# Patient Record
Sex: Male | Born: 1950 | Race: White | Hispanic: No | Marital: Married | Smoking: Former smoker
Health system: Southern US, Community
[De-identification: ages and names within clinical notes are randomized; demographics above are authoritative.]

## PROBLEM LIST (undated history)

## (undated) DIAGNOSIS — C679 Malignant neoplasm of bladder, unspecified: Secondary | ICD-10-CM

## (undated) DIAGNOSIS — G629 Polyneuropathy, unspecified: Secondary | ICD-10-CM

## (undated) DIAGNOSIS — H539 Unspecified visual disturbance: Secondary | ICD-10-CM

## (undated) DIAGNOSIS — E119 Type 2 diabetes mellitus without complications: Secondary | ICD-10-CM

## (undated) HISTORY — DX: Polyneuropathy, unspecified: G62.9

## (undated) HISTORY — PX: INGUINAL HERNIA REPAIR: SUR1180

## (undated) HISTORY — DX: Type 2 diabetes mellitus without complications: E11.9

## (undated) HISTORY — DX: Malignant neoplasm of bladder, unspecified: C67.9

## (undated) HISTORY — DX: Unspecified visual disturbance: H53.9

## (undated) HISTORY — PX: OTHER SURGICAL HISTORY: SHX169

---

## 2016-09-11 LAB — HEMOGLOBIN A1C: HEMOGLOBIN A1C: 11.5

## 2016-10-29 ENCOUNTER — Encounter: Payer: Self-pay | Admitting: "Endocrinology

## 2016-10-29 ENCOUNTER — Ambulatory Visit (INDEPENDENT_AMBULATORY_CARE_PROVIDER_SITE_OTHER): Payer: 59 | Admitting: "Endocrinology

## 2016-10-29 VITALS — BP 140/84 | HR 63 | Ht 69.0 in | Wt 189.0 lb

## 2016-10-29 DIAGNOSIS — E1165 Type 2 diabetes mellitus with hyperglycemia: Secondary | ICD-10-CM

## 2016-10-29 DIAGNOSIS — E118 Type 2 diabetes mellitus with unspecified complications: Secondary | ICD-10-CM | POA: Diagnosis not present

## 2016-10-29 DIAGNOSIS — IMO0002 Reserved for concepts with insufficient information to code with codable children: Secondary | ICD-10-CM | POA: Insufficient documentation

## 2016-10-29 NOTE — Progress Notes (Signed)
Subjective:    Patient ID: Danny Wagner, male    DOB: 09-Feb-1951. Patient is being seen in consultation for management of diabetes requested by  Josem Kaufmann, MD  Past Medical History:  Diagnosis Date  . Bladder cancer ([place])   . Diabetes mellitus, type II [facility])    Past Surgical History:  Procedure Laterality Date  . bladder cancer    . INGUINAL HERNIA REPAIR     Social History   Social History  . Marital status: Married    Spouse name: N/A  . Number of children: N/A  . Years of education: N/A   Social History Main Topics  . Smoking status: Former Research scientist (life sciences)  . Smokeless tobacco: Never Used  . Alcohol use Yes     Comment: 5-6 beers/wk  . Drug use: No  . Sexual activity: Not Asked   Other Topics Concern  . None   Social History Narrative  . None   Outpatient Encounter Prescriptions as of 10/29/2016  Medication Sig  . metFORMIN (GLUCOPHAGE) 500 MG tablet Take by mouth 2 (two) times daily with a meal.   No facility-administered encounter medications on file as of 10/29/2016.    ALLERGIES: No Known Allergies VACCINATION STATUS:  There is no immunization history on file for this patient.  Diabetes  He presents for his initial diabetic visit. He has type 2 diabetes mellitus. Onset time: He was diagnosed at approximate age of 23 years. His disease course has been improving. There are no hypoglycemic associated symptoms. Pertinent negatives for hypoglycemia include no confusion, headaches, pallor or seizures. Associated symptoms include blurred vision, polydipsia and polyuria. Pertinent negatives for diabetes include no chest pain, no fatigue, no polyphagia and no weakness. There are no hypoglycemic complications. Symptoms are improving (He was diagnosed in April 2018 with A1c of 11.5%, with subsequent improvement in his glycemic profile.). There are no diabetic complications. Risk factors for coronary artery disease include diabetes mellitus, dyslipidemia, tobacco exposure,  male sex, hypertension and family history. Current diabetic treatment includes oral agent (monotherapy). His weight is stable. He is following a generally unhealthy diet. When asked about meal planning, he reported none. He has not had a previous visit with a dietitian. He participates in exercise intermittently. His overall blood glucose range is 140-180 mg/dl. An ACE inhibitor/angiotensin II receptor blocker is not being taken. He does not see a podiatrist.Eye exam is not current.    Review of Systems  Constitutional: Negative for chills, fatigue, fever and unexpected weight change.  HENT: Negative for dental problem, mouth sores and trouble swallowing.   Eyes: Positive for blurred vision. Negative for visual disturbance.  Respiratory: Negative for cough, choking, chest tightness, shortness of breath and wheezing.   Cardiovascular: Negative for chest pain, palpitations and leg swelling.  Gastrointestinal: Negative for abdominal distention, abdominal pain, constipation, diarrhea, nausea and vomiting.  Endocrine: Positive for polydipsia and polyuria. Negative for polyphagia.  Genitourinary: Negative for dysuria, flank pain, hematuria and urgency.  Musculoskeletal: Negative for back pain, gait problem, myalgias and neck pain.  Skin: Negative for pallor, rash and wound.  Neurological: Negative for seizures, syncope, weakness, numbness and headaches.  Psychiatric/Behavioral: Negative.  Negative for confusion and dysphoric mood.    Objective:    BP 140/84   Pulse 63   Ht 5\' 9"  (1.753 m)   Wt 189 lb (85.7 kg)   BMI 27.91 kg/m   Wt Readings from Last 3 Encounters:  10/29/16 189 lb (85.7 kg)    Physical Exam  Constitutional: He is oriented to person, place, and time. He appears well-developed and well-nourished. He is cooperative. No distress.  HENT:  Head: Normocephalic and atraumatic.  Eyes: EOM are normal.  Neck: Normal range of motion. Neck supple. No tracheal deviation present. No  thyromegaly present.  Cardiovascular: Normal rate, S1 normal, S2 normal and normal heart sounds.  Exam reveals no gallop.   No murmur heard. Pulses:      Dorsalis pedis pulses are 1+ on the right side, and 1+ on the left side.       Posterior tibial pulses are 1+ on the right side, and 1+ on the left side.  Pulmonary/Chest: Breath sounds normal. No respiratory distress. He has no wheezes.  Abdominal: Soft. Bowel sounds are normal. He exhibits no distension. There is no tenderness. There is no guarding and no CVA tenderness.  Musculoskeletal: He exhibits no edema.       Right shoulder: He exhibits no swelling and no deformity.  Neurological: He is alert and oriented to person, place, and time. He has normal strength and normal reflexes. No cranial nerve deficit or sensory deficit. Gait normal.  + Hard of hearing.  Skin: Skin is warm and dry. No rash noted. No cyanosis. Nails show no clubbing.  Psychiatric: He has a normal mood and affect. His speech is normal and behavior is normal. Judgment and thought content normal. Cognition and memory are normal.   Recent Results (from the past 2160 hour(s))  Hemoglobin A1c     Status: None   Collection Time: 09/11/16 12:00 AM  Result Value Ref Range   Hemoglobin A1C 11.5       Assessment & Plan:   1. Uncontrolled type 2 diabetes mellitus with complication, without long-term current use of insulin ([place])  - Patient has currently uncontrolled symptomatic type 2 DM since  66 years of age,  with most recent A1c of 11.5 %. He has improved his glycemic profile on metformin since his diagnosis. Recent labs reviewed.   He does not report cross competitions from his diabetes, however patient remains at a high risk for more acute and chronic complications which include CAD, CVA, CKD, retinopathy, and neuropathy. These are all discussed in detail with the patient.  - I have counseled the patient on diet management and weight loss, by adopting a carbohydrate  restricted/protein rich diet.  - Suggestion is made for patient to avoid simple carbohydrates   from his diet including Cakes , Desserts, Ice Cream,  Soda (  diet and regular) , Sweet Tea , Candies,  Chips, Cookies, Artificial Sweeteners,   and "Sugar-free" Products . This will help patient to have stable blood glucose profile and potentially avoid unintended weight gain.  - I encouraged the patient to switch to  unprocessed or minimally processed complex starch and increased protein intake (animal or plant source), fruits, and vegetables.  - Patient is advised to stick to a routine mealtimes to eat 3 meals  a day and avoid unnecessary snacks ( to snack only to correct hypoglycemia).  - The patient will be scheduled with Jearld Fenton, RDN, CDE for individualized DM education.  - I have approached patient with the following individualized plan to manage diabetes and patient agrees:   - Based on his clinical response to therapy with metformin with glycemic profile approaching target, he will not need additional treatment at this time.  - I will contmetformin 500 minute grams by mouth twice a day, therapeutically suitable for patient . - I  will discontinue glimepiride, risk outweighs benefit for this patient. - Patient has choices in  incretin therapy, as well as SGLT 2 inhibitors  as appropriate next visit. - Patient specific target  A1c;  LDL, HDL, Triglycerides, and  Waist Circumference were discussed in detail.  2) BP/HTN: controlled.  Patient is not on any medications.  3) Lipids/HPL: lipid panel unknown.   and is not on statins. He will be considered for fasting lipid panel on subsequent visit.  4)  Weight/Diet: CDE Consult will be initiated , exercise, and detailed carbohydrates information provided.  5) Chronic Care/Health Maintenance:  -Patient is not  on ACEI/ARB and Statin medications and encouraged to continue to follow up with Ophthalmology, Podiatrist at least yearly or according  to recommendations, and advised to   stay away from smoking. I have recommended yearly flu vaccine and pneumonia vaccination at least every 5 years; moderate intensity exercise for up to 150 minutes weekly; and  sleep for at least 7 hours a day.  - 60 minutes of time was spent on the care of this patient , 50% of which was applied for counseling on diabetes complications and their preventions.  - Patient to bring meter and  blood glucose logs during his next visit.   - I advised patient to maintain close follow up with Josem Kaufmann, MD for primary care needs.  Follow up plan: - Return in about 9 weeks (around 12/31/2016) for follow up with pre-visit labs.  Glade Lloyd, MD Phone: [phone]  Fax: [phone]   10/29/2016, 3:38 PM

## 2016-10-29 NOTE — Patient Instructions (Signed)

## 2016-12-31 ENCOUNTER — Ambulatory Visit: Payer: 59 | Admitting: "Endocrinology

## 2017-01-10 LAB — LIPID PANEL
Cholesterol: 139 (ref 0–200)
HDL: 36 (ref 35–70)
LDL Cholesterol: 86
Triglycerides: 83 (ref 40–160)

## 2017-01-10 LAB — HEMOGLOBIN A1C: HEMOGLOBIN A1C: 7.6

## 2017-01-10 LAB — TSH: TSH: 1.45 (ref <=5.90)

## 2017-01-13 ENCOUNTER — Ambulatory Visit: Payer: 59 | Admitting: "Endocrinology

## 2017-01-15 ENCOUNTER — Ambulatory Visit (INDEPENDENT_AMBULATORY_CARE_PROVIDER_SITE_OTHER): Payer: 59 | Admitting: "Endocrinology

## 2017-01-15 ENCOUNTER — Encounter: Payer: Self-pay | Admitting: "Endocrinology

## 2017-01-15 VITALS — BP 121/76 | HR 61 | Ht 69.0 in | Wt 185.0 lb

## 2017-01-15 DIAGNOSIS — E118 Type 2 diabetes mellitus with unspecified complications: Secondary | ICD-10-CM | POA: Diagnosis not present

## 2017-01-15 DIAGNOSIS — E1165 Type 2 diabetes mellitus with hyperglycemia: Secondary | ICD-10-CM

## 2017-01-15 DIAGNOSIS — IMO0002 Reserved for concepts with insufficient information to code with codable children: Secondary | ICD-10-CM

## 2017-01-15 MED ORDER — DAPAGLIFLOZIN PROPANEDIOL 5 MG PO TABS: 5.0000 mg | ORAL_TABLET | Freq: Every day | ORAL | 3 refills | Status: DC

## 2017-01-15 NOTE — Patient Instructions (Signed)

## 2017-01-15 NOTE — Progress Notes (Signed)
Subjective:    Patient ID: Danny Wagner, male    DOB: 02-03-1951. Patient is being seen in consultation for management of diabetes requested by  Josem Kaufmann, MD  Past Medical History:  Diagnosis Date  . Bladder cancer ([place])   . Diabetes mellitus, type II [facility])    Past Surgical History:  Procedure Laterality Date  . bladder cancer    . INGUINAL HERNIA REPAIR     Social History   Social History  . Marital status: Married    Spouse name: N/A  . Number of children: N/A  . Years of education: N/A   Social History Main Topics  . Smoking status: Former Research scientist (life sciences)  . Smokeless tobacco: Never Used  . Alcohol use Yes     Comment: 5-6 beers/wk  . Drug use: No  . Sexual activity: Not Asked   Other Topics Concern  . None   Social History Narrative  . None   Outpatient Encounter Prescriptions as of 01/15/2017  Medication Sig  . dapagliflozin propanediol (FARXIGA) 5 MG TABS tablet Take 5 mg by mouth daily.  . metFORMIN (GLUCOPHAGE) 500 MG tablet Take by mouth 2 (two) times daily with a meal.   No facility-administered encounter medications on file as of 01/15/2017.    ALLERGIES: No Known Allergies VACCINATION STATUS:  There is no immunization history on file for this patient.  Diabetes  He presents for his follow-up diabetic visit. He has type 2 diabetes mellitus. Onset time: He was diagnosed at approximate age of 50 years. His disease course has been improving. There are no hypoglycemic associated symptoms. Pertinent negatives for hypoglycemia include no confusion, headaches, pallor or seizures. Associated symptoms include blurred vision. Pertinent negatives for diabetes include no chest pain, no fatigue, no polydipsia, no polyphagia, no polyuria and no weakness. There are no hypoglycemic complications. Symptoms are improving (He was diagnosed in April 2018 with A1c of 11.5%.). There are no diabetic complications. Risk factors for coronary artery disease include diabetes  mellitus, dyslipidemia, tobacco exposure, male sex, hypertension and family history. Current diabetic treatment includes oral agent (monotherapy). His weight is stable. He is following a generally unhealthy diet. When asked about meal planning, he reported none. He has not had a previous visit with a dietitian. He participates in exercise intermittently. An ACE inhibitor/angiotensin II receptor blocker is not being taken. He does not see a podiatrist.Eye exam is not current.    Review of Systems  Constitutional: Negative for chills, fatigue, fever and unexpected weight change.  HENT: Negative for dental problem, mouth sores and trouble swallowing.   Eyes: Positive for blurred vision. Negative for visual disturbance.  Respiratory: Negative for cough, choking, chest tightness, shortness of breath and wheezing.   Cardiovascular: Negative for chest pain, palpitations and leg swelling.  Gastrointestinal: Negative for abdominal distention, abdominal pain, constipation, diarrhea, nausea and vomiting.  Endocrine: Negative for polydipsia, polyphagia and polyuria.  Genitourinary: Negative for dysuria, flank pain, hematuria and urgency.  Musculoskeletal: Negative for back pain, gait problem, myalgias and neck pain.  Skin: Negative for pallor, rash and wound.  Neurological: Negative for seizures, syncope, weakness, numbness and headaches.  Psychiatric/Behavioral: Negative.  Negative for confusion and dysphoric mood.    Objective:    BP 121/76   Pulse 61   Ht 5\' 9"  (1.753 m)   Wt 185 lb (83.9 kg)   BMI 27.32 kg/m   Wt Readings from Last 3 Encounters:  01/15/17 185 lb (83.9 kg)  10/29/16 189 lb (85.7 kg)  Physical Exam  Constitutional: He is oriented to person, place, and time. He appears well-developed and well-nourished. He is cooperative. No distress.  HENT:  Head: Normocephalic and atraumatic.  Eyes: EOM are normal.  Neck: Normal range of motion. Neck supple. No tracheal deviation present.  No thyromegaly present.  Cardiovascular: Normal rate, S1 normal, S2 normal and normal heart sounds.  Exam reveals no gallop.   No murmur heard. Pulses:      Dorsalis pedis pulses are 1+ on the right side, and 1+ on the left side.       Posterior tibial pulses are 1+ on the right side, and 1+ on the left side.  Pulmonary/Chest: Breath sounds normal. No respiratory distress. He has no wheezes.  Abdominal: Soft. Bowel sounds are normal. He exhibits no distension. There is no tenderness. There is no guarding and no CVA tenderness.  Musculoskeletal: He exhibits no edema.       Right shoulder: He exhibits no swelling and no deformity.  Neurological: He is alert and oriented to person, place, and time. He has normal strength and normal reflexes. No cranial nerve deficit or sensory deficit. Gait normal.  + Hard of hearing.  Skin: Skin is warm and dry. No rash noted. No cyanosis. Nails show no clubbing.  Psychiatric: He has a normal mood and affect. His speech is normal and behavior is normal. Judgment and thought content normal. Cognition and memory are normal.    Recent Results (from the past 2160 hour(s))  Lipid panel     Status: None   Collection Time: 01/10/17 12:00 AM  Result Value Ref Range   Triglycerides 83 40 - 160   Cholesterol 139 0 - 200   HDL 36 35 - 70   LDL Cholesterol 86   Hemoglobin A1c     Status: None   Collection Time: 01/10/17 12:00 AM  Result Value Ref Range   Hemoglobin A1C 7.6   TSH     Status: None   Collection Time: 01/10/17 12:00 AM  Result Value Ref Range   TSH 1.45 0.41 - 5.90    Comment: free t4 1.04     Assessment & Plan:   1. Uncontrolled type 2 diabetes mellitus with complication, without long-term current use of insulin ([place])  - Patient has currently uncontrolled symptomatic type 2 DM since  66 years of age. - His most recent labs show A1c 7.6% improving from 11.5%. Recent labs reviewed.   He does not report gross complications from his diabetes,  however patient remains at a high risk for more acute and chronic complications which include CAD, CVA, CKD, retinopathy, and neuropathy. These are all discussed in detail with the patient.  - I have counseled the patient on diet management and weight loss, by adopting a carbohydrate restricted/protein rich diet.  Suggestion is made for him to avoid simple carbohydrates  from his diet including Cakes, Sweet Desserts, Ice Cream, Soda (diet and regular), Sweet Tea, Candies, Chips, Cookies, Store Bought Juices, Alcohol in Excess of  1-2 drinks a day, Artificial Sweeteners, and "Sugar-free" Products. This will help patient to have stable blood glucose profile and potentially avoid unintended weight gain.   - I encouraged the patient to switch to  unprocessed or minimally processed complex starch and increased protein intake (animal or plant source), fruits, and vegetables.  - Patient is advised to stick to a routine mealtimes to eat 3 meals  a day and avoid unnecessary snacks ( to snack only to correct hypoglycemia).   -  I have approached patient with the following individualized plan to manage diabetes and patient agrees:   - Based on his clinical response to therapy with metformin with glycemic profile approaching target, he will not need   insulin treatment at this time.  - I will contmetformin 500 minute grams by mouth twice a day, therapeutically suitable for patient . - I have discussed and added low-dose Farxiga 5 mg by mouth daily for him. Side effects and precaution discussed with him.    - Patient  is not a suitable candidate for  incretin therapy. - Patient specific target  A1c;  LDL, HDL, Triglycerides, and  Waist Circumference were discussed in detail.  2) BP/HTN: controlled.  Patient is not on any medications.  3) Lipids/HPL: lipid panel is controlled with LDL 86.   and is not on statins.  he wishes to minimize medications including statins.  4)  Weight/Diet: CDE Consult has been   initiated , exercise, and detailed carbohydrates information provided.  5) Chronic Care/Health Maintenance:  -Patient is not  on ACEI/ARB and Statin medications and encouraged to continue to follow up with Ophthalmology, Podiatrist at least yearly or according to recommendations, and advised to   stay away from smoking. I have recommended yearly flu vaccine and pneumonia vaccination at least every 5 years; moderate intensity exercise for up to 150 minutes weekly; and  sleep for at least 7 hours a day.  - Time spent with the patient: 25 min, of which >50% was spent in reviewing his sugar logs , discussing his hypo- and hyper-glycemic episodes, reviewing  previous labs and insulin doses and developing a plan to avoid hypo- and hyper-glycemia.  .  - Patient to bring meter and  blood glucose logs during his next visit.   - I advised patient to maintain close follow up with Josem Kaufmann, MD for primary care needs.  Follow up plan: - Return in about 4 months (around 05/17/2017) for follow up with pre-visit labs.  Glade Lloyd, MD Phone: [phone]  Fax: [phone]   01/15/2017, 3:29 PM

## 2017-01-30 ENCOUNTER — Ambulatory Visit: Payer: 59 | Admitting: "Endocrinology

## 2017-05-19 ENCOUNTER — Ambulatory Visit: Payer: 59 | Admitting: "Endocrinology

## 2017-08-18 ENCOUNTER — Other Ambulatory Visit: Payer: Self-pay | Admitting: "Endocrinology

## 2017-08-18 ENCOUNTER — Telehealth: Payer: Self-pay | Admitting: "Endocrinology

## 2017-08-18 ENCOUNTER — Other Ambulatory Visit: Payer: Self-pay

## 2017-08-18 DIAGNOSIS — E1165 Type 2 diabetes mellitus with hyperglycemia: Secondary | ICD-10-CM

## 2017-08-18 NOTE — Telephone Encounter (Signed)
Danny Wagner is asking for a refill on metFORMIN (GLUCOPHAGE) 500 MG tablet sent to [facility]

## 2017-08-18 NOTE — Telephone Encounter (Signed)
He needs to return for office visit with repeat labs before refill.

## 2017-08-21 ENCOUNTER — Other Ambulatory Visit: Payer: Self-pay | Admitting: "Endocrinology

## 2017-08-22 LAB — RENAL FUNCTION PANEL
Albumin: 4.5 g/dL (ref 3.6–4.8)
BUN / CREAT RATIO: 17 (ref 10–24)
BUN: 18 mg/dL (ref 8–27)
CALCIUM: 9.8 mg/dL (ref 8.6–10.2)
CO2: 25 mmol/L (ref 20–29)
CREATININE: 1.08 mg/dL (ref 0.76–1.27)
Chloride: 102 mmol/L (ref 96–106)
GFR calc Af Amer: 83 mL/min/{1.73_m2} (ref >59)
GFR, EST NON AFRICAN AMERICAN: 72 mL/min/{1.73_m2} (ref >59)
GLUCOSE: 155 mg/dL — AB (ref 65–99)
PHOSPHORUS: 3.4 mg/dL (ref 2.5–4.5)
POTASSIUM: 5.2 mmol/L (ref 3.5–5.2)
SODIUM: 141 mmol/L (ref 134–144)

## 2017-08-22 LAB — HGB A1C W/O EAG: Hgb A1c MFr Bld: 7 % — ABNORMAL HIGH (ref 4.8–5.6)

## 2017-08-28 ENCOUNTER — Other Ambulatory Visit: Payer: Self-pay | Admitting: "Endocrinology

## 2017-08-28 MED ORDER — METFORMIN HCL 500 MG PO TABS: 500.0000 mg | ORAL_TABLET | Freq: Two times a day (BID) | ORAL | 2 refills | Status: DC

## 2017-08-28 NOTE — Telephone Encounter (Signed)
Danny Wagner has had labs drawn and is scheduled for f/u on April 3 and he is asking for a refill on metFORMIN (GLUCOPHAGE) 500 MG tablet please advise?

## 2017-08-29 NOTE — Telephone Encounter (Signed)
Rx sent by Dr Dorris Fetch

## 2017-09-01 ENCOUNTER — Encounter: Payer: Self-pay | Admitting: "Endocrinology

## 2017-09-01 ENCOUNTER — Ambulatory Visit (INDEPENDENT_AMBULATORY_CARE_PROVIDER_SITE_OTHER): Payer: 59 | Admitting: "Endocrinology

## 2017-09-01 VITALS — BP 134/89 | HR 80 | Ht 69.0 in | Wt 181.0 lb

## 2017-09-01 DIAGNOSIS — E1165 Type 2 diabetes mellitus with hyperglycemia: Secondary | ICD-10-CM | POA: Diagnosis not present

## 2017-09-01 MED ORDER — METFORMIN HCL 500 MG PO TABS: 500.0000 mg | ORAL_TABLET | Freq: Two times a day (BID) | ORAL | 4 refills | Status: DC

## 2017-09-01 MED ORDER — GABAPENTIN 100 MG PO CAPS: 100.0000 mg | ORAL_CAPSULE | Freq: Two times a day (BID) | ORAL | 4 refills | Status: DC

## 2017-09-01 NOTE — Progress Notes (Signed)
Subjective:    Patient ID: Danny Wagner, male    DOB: 11/29/1950. Patient is being seen in consultation for management of diabetes requested by  Josem Kaufmann, MD  Past Medical History:  Diagnosis Date  . Bladder cancer ([place])   . Diabetes mellitus, type II [facility])    Past Surgical History:  Procedure Laterality Date  . bladder cancer    . INGUINAL HERNIA REPAIR     Social History   Socioeconomic History  . Marital status: Married    Spouse name: Not on file  . Number of children: Not on file  . Years of education: Not on file  . Highest education level: Not on file  Occupational History  . Not on file  Social Needs  . Financial resource strain: Not on file  . Food insecurity:    Worry: Not on file    Inability: Not on file  . Transportation needs:    Medical: Not on file    Non-medical: Not on file  Tobacco Use  . Smoking status: Former Research scientist (life sciences)  . Smokeless tobacco: Never Used  Substance and Sexual Activity  . Alcohol use: Yes    Comment: 5-6 beers/wk  . Drug use: No  . Sexual activity: Not on file  Lifestyle  . Physical activity:    Days per week: Not on file    Minutes per session: Not on file  . Stress: Not on file  Relationships  . Social connections:    Talks on phone: Not on file    Gets together: Not on file    Attends religious service: Not on file    Active member of club or organization: Not on file    Attends meetings of clubs or organizations: Not on file    Relationship status: Not on file  Other Topics Concern  . Not on file  Social History Narrative  . Not on file   Outpatient Encounter Medications as of 09/01/2017  Medication Sig  . gabapentin (NEURONTIN) 100 MG capsule Take 1 capsule (100 mg total) by mouth 2 (two) times daily.  . metFORMIN (GLUCOPHAGE) 500 MG tablet Take 1 tablet (500 mg total) by mouth 2 (two) times daily with a meal.  . [DISCONTINUED] dapagliflozin propanediol (FARXIGA) 5 MG TABS tablet Take 5 mg by mouth daily.  .  [DISCONTINUED] metFORMIN (GLUCOPHAGE) 500 MG tablet Take 1 tablet (500 mg total) by mouth 2 (two) times daily with a meal.   No facility-administered encounter medications on file as of 09/01/2017.    ALLERGIES: No Known Allergies VACCINATION STATUS:  There is no immunization history on file for this patient.  Diabetes  He presents for his follow-up diabetic visit. He has type 2 diabetes mellitus. Onset time: He was diagnosed at approximate age of 67 years. His disease course has been improving. There are no hypoglycemic associated symptoms. Pertinent negatives for hypoglycemia include no confusion, headaches, pallor or seizures. Associated symptoms include blurred vision. Pertinent negatives for diabetes include no chest pain, no fatigue, no polydipsia, no polyphagia, no polyuria and no weakness. There are no hypoglycemic complications. Symptoms are improving (He was diagnosed in April 2018 with A1c of 11.5%.). Diabetic complications include peripheral neuropathy. Risk factors for coronary artery disease include diabetes mellitus, dyslipidemia, tobacco exposure, male sex, hypertension and family history. Current diabetic treatment includes oral agent (monotherapy). His weight is decreasing steadily. He is following a generally unhealthy diet. When asked about meal planning, he reported none. He has not had a previous  visit with a dietitian. He participates in exercise intermittently. An ACE inhibitor/angiotensin II receptor blocker is not being taken. He does not see a podiatrist.Eye exam is not current.    Review of Systems  Constitutional: Negative for chills, fatigue, fever and unexpected weight change.  HENT: Negative for dental problem, mouth sores and trouble swallowing.   Eyes: Positive for blurred vision. Negative for visual disturbance.  Respiratory: Negative for cough, choking, chest tightness, shortness of breath and wheezing.   Cardiovascular: Negative for chest pain, palpitations and  leg swelling.  Gastrointestinal: Negative for abdominal distention, abdominal pain, constipation, diarrhea, nausea and vomiting.  Endocrine: Negative for polydipsia, polyphagia and polyuria.  Genitourinary: Negative for dysuria, flank pain, hematuria and urgency.  Musculoskeletal: Negative for back pain, gait problem, myalgias and neck pain.  Skin: Negative for pallor, rash and wound.  Neurological: Negative for seizures, syncope, weakness, numbness and headaches.  Psychiatric/Behavioral: Negative.  Negative for confusion and dysphoric mood.    Objective:    BP 134/89   Pulse 80   Ht 5\' 9"  (1.753 m)   Wt 181 lb (82.1 kg)   BMI 26.73 kg/m   Wt Readings from Last 3 Encounters:  09/01/17 181 lb (82.1 kg)  01/15/17 185 lb (83.9 kg)  10/29/16 189 lb (85.7 kg)    Physical Exam  Constitutional: He is oriented to person, place, and time. He appears well-developed and well-nourished. He is cooperative. No distress.  HENT:  Head: Normocephalic and atraumatic.  Eyes: EOM are normal.  Neck: Normal range of motion. Neck supple. No tracheal deviation present. No thyromegaly present.  Cardiovascular: Normal rate, S1 normal, S2 normal and normal heart sounds. Exam reveals no gallop.  No murmur heard. Pulses:      Dorsalis pedis pulses are 1+ on the right side, and 1+ on the left side.       Posterior tibial pulses are 1+ on the right side, and 1+ on the left side.  Pulmonary/Chest: Breath sounds normal. No respiratory distress. He has no wheezes.  Abdominal: Soft. Bowel sounds are normal. He exhibits no distension. There is no tenderness. There is no guarding and no CVA tenderness.  Musculoskeletal: He exhibits no edema.       Right shoulder: He exhibits no swelling and no deformity.  Neurological: He is alert and oriented to person, place, and time. He has normal strength and normal reflexes. No cranial nerve deficit or sensory deficit. Gait normal.  + Hard of hearing.  Skin: Skin is warm  and dry. No rash noted. No cyanosis. Nails show no clubbing.  Psychiatric: He has a normal mood and affect. His speech is normal and behavior is normal. Judgment and thought content normal. Cognition and memory are normal.    Recent Results (from the past 2160 hour(s))  Renal function panel     Status: Abnormal   Collection Time: 08/21/17  3:25 PM  Result Value Ref Range   Glucose 155 (H) 65 - 99 mg/dL   BUN 18 8 - 27 mg/dL   Creatinine, Ser 1.08 0.76 - 1.27 mg/dL   GFR calc non Af Amer 72 >59 mL/min/1.73   GFR calc Af Amer 83 >59 mL/min/1.73   BUN/Creatinine Ratio 17 10 - 24   Sodium 141 134 - 144 mmol/L   Potassium 5.2 3.5 - 5.2 mmol/L   Chloride 102 96 - 106 mmol/L   CO2 25 20 - 29 mmol/L   Calcium 9.8 8.6 - 10.2 mg/dL   Phosphorus 3.4 2.5 -  4.5 mg/dL   Albumin 4.5 3.6 - 4.8 g/dL  Hgb A1c w/o eAG     Status: Abnormal   Collection Time: 08/21/17  3:25 PM  Result Value Ref Range   Hgb A1c MFr Bld 7.0 (H) 4.8 - 5.6 %    Comment:          Prediabetes: 5.7 - 6.4          Diabetes: >6.4          Glycemic control for adults with diabetes: <7.0      Assessment & Plan:   1. Uncontrolled type 2 diabetes mellitus with complication, without long-term current use of insulin ([place])  - Patient has currently uncontrolled symptomatic type 2 DM since  67 years of age. - His most recent labs show A1c 7%, progressively improving from 11.5%. Recent labs reviewed.   He does not report gross complications from his diabetes, however patient remains at a high risk for more acute and chronic complications which include CAD, CVA, CKD, retinopathy, and neuropathy. These are all discussed in detail with the patient.  - I have counseled the patient on diet management and weight loss, by adopting a carbohydrate restricted/protein rich diet.  -  Suggestion is made for him to avoid simple carbohydrates  from his diet including Cakes, Sweet Desserts / Pastries, Ice Cream, Soda (diet and regular), Sweet  Tea, Candies, Chips, Cookies, Store Bought Juices, Alcohol in Excess of  1-2 drinks a day, Artificial Sweeteners, and "Sugar-free" Products. This will help patient to have stable blood glucose profile and potentially avoid unintended weight gain.  - I encouraged the patient to switch to  unprocessed or minimally processed complex starch and increased protein intake (animal or plant source), fruits, and vegetables.  - Patient is advised to stick to a routine mealtimes to eat 3 meals  a day and avoid unnecessary snacks ( to snack only to correct hypoglycemia).   - I have approached patient with the following individualized plan to manage diabetes and patient agrees:   - Based on his clinical response to therapy with improved A1c of 7%, he will not need   insulin treatment at this time.  - I advised him to continue metformin 500 mg p.o. twice daily with meals.   -Advised him to finish his current supplies of first cigar and discontinued.  Side effects and precaution discussed with him.    - Patient  is not a suitable candidate for  incretin therapy. - Patient specific target  A1c;  LDL, HDL, Triglycerides, and  Waist Circumference were discussed in detail.  2) BP/HTN: His blood pressure is controlled to target.    Patient is not on any medications.  3) Lipids/HPL: lipid panel is controlled with LDL 86.   and is not on statins.  he wishes to minimize medications including statins.   4)  Weight/Diet: CDE Consult has been  initiated , exercise, and detailed carbohydrates information provided.  5) Chronic Care/Health Maintenance: -Due to his complaint of bilateral tingling on his feet, I gave him gabapentin 100 mg p.o. twice daily as needed. -Patient is not  on ACEI/ARB and Statin medications and encouraged to continue to follow up with Ophthalmology, Podiatrist at least yearly or according to recommendations, and advised to   stay away from smoking. I have recommended yearly flu vaccine and  pneumonia vaccination at least every 5 years; moderate intensity exercise for up to 150 minutes weekly; and  sleep for at least 7 hours a  day. - I advised patient to maintain close follow up with Josem Kaufmann, MD for primary care needs. - Time spent with the patient: 25 min, of which >50% was spent in reviewing his  current and  previous labs, previous treatments, and medications doses and developing a plan for long-term care.  [name] participated in the discussions, expressed understanding, and voiced agreement with the above plans.  All questions were answered to his satisfaction. he is encouraged to contact clinic should he have any questions or concerns prior to his return visit.  Follow up plan: - Return in about 4 months (around 01/01/2018) for follow up with pre-visit labs.  Glade Lloyd, MD Phone: [phone]  Fax: [phone]   09/01/2017, 3:51 PM

## 2017-09-02 ENCOUNTER — Ambulatory Visit: Payer: 59 | Admitting: "Endocrinology

## 2017-09-03 ENCOUNTER — Ambulatory Visit: Payer: 59 | Admitting: "Endocrinology

## 2017-11-11 ENCOUNTER — Ambulatory Visit (INDEPENDENT_AMBULATORY_CARE_PROVIDER_SITE_OTHER): Payer: 59 | Admitting: Neurology

## 2017-11-11 ENCOUNTER — Encounter: Payer: Self-pay | Admitting: Neurology

## 2017-11-11 ENCOUNTER — Encounter

## 2017-11-11 ENCOUNTER — Other Ambulatory Visit: Payer: Self-pay

## 2017-11-11 VITALS — BP 135/84 | HR 71 | Resp 18 | Ht 69.0 in | Wt 181.5 lb

## 2017-11-11 DIAGNOSIS — R42 Dizziness and giddiness: Secondary | ICD-10-CM | POA: Insufficient documentation

## 2017-11-11 DIAGNOSIS — G629 Polyneuropathy, unspecified: Secondary | ICD-10-CM | POA: Diagnosis not present

## 2017-11-11 DIAGNOSIS — M542 Cervicalgia: Secondary | ICD-10-CM | POA: Diagnosis not present

## 2017-11-11 DIAGNOSIS — E1142 Type 2 diabetes mellitus with diabetic polyneuropathy: Secondary | ICD-10-CM

## 2017-11-11 DIAGNOSIS — R200 Anesthesia of skin: Secondary | ICD-10-CM

## 2017-11-11 NOTE — Progress Notes (Addendum)
[facility]  PATIENT: Danny Wagner DOB: July 28, 1950  REFERRING DOCTOR OR PCP: Dr.  Pat Kocher SOURCE: Patient, notes from Dr. Tawny Asal,  _________________________________   HISTORICAL  CHIEF COMPLAINT:  Chief Complaint  Patient presents with  . Numbness    Danny Wagner is here for eval of numbness/tingling both feet onset 3 yrs. ago and some improved with Gabapentin. Dx. with Diabetes last yr. Sts. has also lost some muscle mass both legs. PCP feels pt. needs EMG/NCV. Pt. also c/o intermittent dizziness, blurry vision onset 2 mos. ago. Sts. thse sx. have improved some since onset.  Has seen opthalmology and his glasses have been updated. Dx. with dry eye and is no drops for this.  Sts. dizziness usually with standing, getting out of his car./fim    HISTORY OF PRESENT ILLNESS:  I had the pleasure seeing a patient, [name], and [facility] for neurologic consultation regarding his numbness.  He is a 67 year old man who has had numbness and tingling in his feet for the past 3 years.  His soles feel 'like there is dried wax coating the foot'.       He had a tingling sensation that is better with gabapentin.    He also has noted some muscle or subcutaneous loss in the thighs bilaterally.  He does not note any weakness in those muscles, however.  He also has lightheadedness that can occur after standing up or if he stands up a while    He has noted it most while playing golf.   He recounts a recent episode while putting when he felt lightheaded and then his vision grayed but did not completely black out.   The episodes are not always associated with hot temperatures.       He has not regularly seen a PCP.     He was diagnosed with DM about a year ago after having a very elevated glucose = 500 and HgbA1c about 12.     He was started on Metformin and sugars are much better controlled and recent HgbA1c = 7.0.      Sometimes he gets a sore sensation in the  upper neck / occiput.  This does not develop into a severe headache.       REVIEW OF SYSTEMS: Constitutional: No fevers, chills, sweats, or change in appetite Eyes: No visual changes, double vision, eye pain Ear, nose and throat: No hearing loss, ear pain, nasal congestion, sore throat Cardiovascular: No chest pain, palpitations Respiratory: No shortness of breath at rest or with exertion.   No wheezes GastrointestinaI: No nausea, vomiting, diarrhea, abdominal pain, fecal incontinence Genitourinary: No dysuria, urinary retention or frequency.  No nocturia. Musculoskeletal: No neck pain, back pain Integumentary: No rash, pruritus, skin lesions Neurological: as above Psychiatric: No depression at this time.  No anxiety Endocrine: Has DM.   Hematologic/Lymphatic: No anemia, purpura, petechiae. Allergic/Immunologic: No itchy/runny eyes, nasal congestion, recent allergic reactions, rashes  ALLERGIES: No Known Allergies  HOME MEDICATIONS:  Current Outpatient Medications:  .  gabapentin (NEURONTIN) 100 MG capsule, Take 1 capsule (100 mg total) by mouth 2 (two) times daily., Disp: 60 capsule, Rfl: 4 .  metFORMIN (GLUCOPHAGE) 500 MG tablet, Take 1 tablet (500 mg total) by mouth 2 (two) times daily with a meal., Disp: 60 tablet, Rfl: 4  PAST MEDICAL HISTORY: Past Medical History:  Diagnosis Date  . Bladder cancer ([place])   . Diabetes mellitus, type II ([place])   . Neuropathy   . Vision  abnormalities     PAST SURGICAL HISTORY: Past Surgical History:  Procedure Laterality Date  . bladder cancer    . INGUINAL HERNIA REPAIR      FAMILY HISTORY: Family History  Problem Relation Age of Onset  . Diabetes Mother   . Hypertension Mother   . Kidney disease Mother   . Cancer Mother        Bile Duct Cancer in 2003  . Stroke Mother   . Hypertension Father   . Heart attack Father   . Stroke Father   . Kidney disease Father   . Congestive Heart Failure Father   . Yves Dill Parkinson White  syndrome Father     SOCIAL HISTORY:  Social History   Socioeconomic History  . Marital status: Married    Spouse name: Not on file  . Number of children: Not on file  . Years of education: Not on file  . Highest education level: Not on file  Occupational History  . Not on file  Social Needs  . Financial resource strain: Not on file  . Food insecurity:    Worry: Not on file    Inability: Not on file  . Transportation needs:    Medical: Not on file    Non-medical: Not on file  Tobacco Use  . Smoking status: Former Research scientist (life sciences)  . Smokeless tobacco: Never Used  Substance and Sexual Activity  . Alcohol use: Yes    Comment: 5-6 beers/wk  . Drug use: No  . Sexual activity: Not on file  Lifestyle  . Physical activity:    Days per week: Not on file    Minutes per session: Not on file  . Stress: Not on file  Relationships  . Social connections:    Talks on phone: Not on file    Gets together: Not on file    Attends religious service: Not on file    Active member of club or organization: Not on file    Attends meetings of clubs or organizations: Not on file    Relationship status: Not on file  . Intimate partner violence:    Fear of current or ex partner: Not on file    Emotionally abused: Not on file    Physically abused: Not on file    Forced sexual activity: Not on file  Other Topics Concern  . Not on file  Social History Narrative  . Not on file     PHYSICAL EXAM  Vitals:   11/11/17 1019  BP: 135/84  Pulse: 71  Resp: 18  Weight: 181 lb 8 oz (82.3 kg)  Height: 5\' 9"  (1.753 m)    Body mass index is 26.8 kg/m.   General: The patient is well-developed and well-nourished and in no acute distress  Neck: The neck is supple, no carotid bruits are noted.  The neck is currently nontender.  Cardiovascular: The heart has a regular rate and rhythm with a normal S1 and S2. There were no murmurs, gallops or rubs. Lungs are clear to auscultation.  Skin: Extremities  are without rash or edema.   Neurologic Exam  Mental status: The patient is alert and oriented x 3 at the time of the examination. The patient has apparent normal recent and remote memory, with an apparently normal attention span and concentration ability.   Speech is normal.  Cranial nerves: Extraocular movements are full. Pupils are equal, round, and reactive to light and accomodation.  Visual fields are full.  Facial symmetry is present.  There is good facial sensation to soft touch bilaterally.Facial strength is normal.  Trapezius and sternocleidomastoid strength is normal. No dysarthria is noted.  The tongue is midline, and the patient has symmetric elevation of the soft palate. No obvious hearing deficits are noted.  Motor:  Muscle bulk is normal.   Tone is normal. Strength is  5 / 5 in all 4 extremities.   Sensory: Sensory testing is intact to pinprick, soft touch and vibration sensation in all 4 extremities.  Coordination: Cerebellar testing reveals good finger-nose-finger and heel-to-shin bilaterally.  Gait and station: Station is normal.   Gait is normal. Tandem gait is normal for age.. Romberg is negative.   Reflexes: Deep tendon reflexes are symmetric and normal bilaterally.   Plantar responses are flexor.    DIAGNOSTIC DATA (LABS, IMAGING, TESTING) - I reviewed patient records, labs, notes, testing and imaging myself where available.  No results found for: WBC, HGB, HCT, MCV, PLT    Component Value Date/Time   NA 141 08/21/2017 1525   K 5.2 08/21/2017 1525   CL 102 08/21/2017 1525   CO2 25 08/21/2017 1525   GLUCOSE 155 (H) 08/21/2017 1525   BUN 18 08/21/2017 1525   CREATININE 1.08 08/21/2017 1525   CALCIUM 9.8 08/21/2017 1525   ALBUMIN 4.5 08/21/2017 1525   GFRNONAA 72 08/21/2017 1525   GFRAA 83 08/21/2017 1525   Lab Results  Component Value Date   CHOL 139 01/10/2017   HDL 36 01/10/2017   LDLCALC 86 01/10/2017   TRIG 83 01/10/2017   Lab Results  Component  Value Date   HGBA1C 7.0 (H) 08/21/2017   No results found for: VITAMINB12 Lab Results  Component Value Date   TSH 1.45 01/10/2017       ASSESSMENT AND PLAN  Diabetic polyneuropathy associated with type 2 diabetes mellitus (HCC)  Polyneuropathy  Numbness  Lightheaded  Neck pain    In summary, Mr. Loiseau is a 67 year old man who was diagnosed with type 2 diabetes last year.  He has a 3-year history of dysesthetic numbness and tingling in the feet without weakness.  He also has episodes of lightheadedness.  It is probable that the numbness and tingling is coming from a mild diabetic polyneuropathy that preceded his diagnosis.  His exam does not show any significant numbness today.  Possibly, the episodes of lightheadedness have a component of autonomic neuropathy.  Additionally he is possibly volume depleted during the episodes that are most likely to occur after he has been playing golf for a while.  He will continue the gabapentin for the dysesthetic sensation as it is helping.  I do not have a good explanation for some of the muscle wasting proximally.  Strength was normal today.  I will check a nerve conduction and EMG study to determine features and severity of the polyneuropathy and determine if there is a reason he has noted some muscle atrophy..  Additionally I will check some blood work for treatable causes (B12, autoimmune, MGUS) I will see him during the EMG portion of the test and discussed the findings with him at that time.  We will call sooner with the results of the blood work.  Thank you for asking me to see Mr. Rawson.  Please let me know if I can be of further assistance with him or other patients in the future.  Meoshia Billing A. Felecia Shelling, MD, [facility] 3/61/4431, 54:00 AM Certified in Neurology, Clinical Neurophysiology, Sleep Medicine, Pain Medicine and Neuroimaging  [facility]  [address], [place], [place] [address] [phone]

## 2017-11-17 ENCOUNTER — Telehealth: Payer: Self-pay | Admitting: *Deleted

## 2017-11-17 LAB — MULTIPLE MYELOMA PANEL, SERUM
Albumin SerPl Elph-Mcnc: 4 g/dL (ref 2.9–4.4)
Albumin/Glob SerPl: 1.6 (ref 0.7–1.7)
Alpha 1: 0.2 g/dL (ref 0.0–0.4)
Alpha2 Glob SerPl Elph-Mcnc: 0.8 g/dL (ref 0.4–1.0)
B-Globulin SerPl Elph-Mcnc: 1.1 g/dL (ref 0.7–1.3)
Gamma Glob SerPl Elph-Mcnc: 0.6 g/dL (ref 0.4–1.8)
Globulin, Total: 2.6 g/dL (ref 2.2–3.9)
IGA/IMMUNOGLOBULIN A, SERUM: 206 mg/dL (ref 61–437)
IGG (IMMUNOGLOBIN G), SERUM: 762 mg/dL (ref 700–1600)
IGM (IMMUNOGLOBULIN M), SRM: 39 mg/dL (ref 20–172)
TOTAL PROTEIN: 6.6 g/dL (ref 6.0–8.5)

## 2017-11-17 LAB — VITAMIN B12: Vitamin B-12: 1050 pg/mL (ref 232–1245)

## 2017-11-17 LAB — SEDIMENTATION RATE: Sed Rate: 5 mm/hr (ref 0–30)

## 2017-11-17 LAB — CRYOGLOBULIN

## 2017-11-17 NOTE — Telephone Encounter (Signed)
-----   Message from Britt Bottom, MD sent at 11/17/2017 12:57 PM EDT ----- Please let the patient know that the lab work is fine.

## 2017-11-17 NOTE — Telephone Encounter (Signed)
VM full/fim 

## 2017-11-17 NOTE — Telephone Encounter (Signed)
LMOM that lab work done in our office was fine.  He does not need to return this call unless he has questions/fim

## 2017-12-01 ENCOUNTER — Ambulatory Visit: Payer: 59 | Admitting: "Endocrinology

## 2017-12-08 ENCOUNTER — Other Ambulatory Visit: Payer: Self-pay | Admitting: "Endocrinology

## 2017-12-23 ENCOUNTER — Ambulatory Visit (INDEPENDENT_AMBULATORY_CARE_PROVIDER_SITE_OTHER): Payer: 59 | Admitting: Neurology

## 2017-12-23 ENCOUNTER — Encounter (INDEPENDENT_AMBULATORY_CARE_PROVIDER_SITE_OTHER): Payer: Self-pay | Admitting: Neurology

## 2017-12-23 DIAGNOSIS — R200 Anesthesia of skin: Secondary | ICD-10-CM | POA: Diagnosis not present

## 2017-12-23 DIAGNOSIS — G629 Polyneuropathy, unspecified: Secondary | ICD-10-CM | POA: Diagnosis not present

## 2017-12-23 DIAGNOSIS — G3281 Cerebellar ataxia in diseases classified elsewhere: Secondary | ICD-10-CM

## 2017-12-23 DIAGNOSIS — Z0289 Encounter for other administrative examinations: Secondary | ICD-10-CM

## 2017-12-23 NOTE — Progress Notes (Signed)
Full Name: Danny Wagner Gender: Male MRN #: [id number] Date of Birth: 07/05/1950    Visit Date: 12/23/2017 13:55 Age: 67 Years 68 Months Old Examining Physician: Arlice Colt, MD  Referring Physician: Felecia Shelling, MD     History:  Danny Wagner is a 67 year old man with numbness in his feet that has progressed over the past couple of years.  Additionally, he has had difficulty with his gait and balance.  Symptoms are equal on the left and the right  Nerve conduction studies:  The right peroneal motor response was normal.  The left peroneal motor response was slightly reduced in amplitude with slight Lee reduced conduction velocity.  The right tibial motor response was normal.  The left tibial response was essentially normal with just slight reduction in conduction velocity.  Bilateral sural and superficial peroneal sensory responses were normal.  F-wave responses were normal.   The sympathetic skin response in the right foot was normal.  Needle EMG: Electromyography of selected muscles of the right leg was performed.    2 of the 4 S1 innervated muscles showed mild chronic denervation and other S1 innervated muscles showed some polyphasic motor units with normal recruitment.  None of the muscles tested showed any acute denervation.  Impression: This NCV/EMG study shows the following: 1.    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 2.    There was no evidence of a significant polyneuropathy or significant mononeuropathy.  Danny Wagner A. Felecia Shelling, MD, PhD, FAAN Certified in Neurology, Clinical Neurophysiology, Sleep Medicine, Pain Medicine and Neuroimaging Director, [place] [facility] [address] [place], [place] [address] [phone]  Clinical note: I discussed with Danny Wagner that the NCV/EMG findings might explain some numbness but could not explain his difficulty with gait  and balance.  We need to check an MRI of the brain and cervical spine without contrast to rule out chronic ischemic findings, NPH and intrinsic or extrinsic spinal cord lesions. --RAS      MNC    Nerve / Sites Muscle Latency Ref. Amplitude Ref. Rel Amp Segments Distance Velocity Ref. Area    ms ms mV mV %  cm m/s m/s mVms  R Peroneal - EDB     Ankle EDB 4.8 ?6.5 4.9 ?2.0 100 Ankle - EDB 9   16.2     Fib head EDB 11.6  5.5  113 Fib head - Ankle 32 47 ?44 20.7     Pop fossa EDB 13.9  5.1  92.3 Pop fossa - Fib head 10 44 ?44 19.6         Pop fossa - Ankle      L Peroneal - EDB     Ankle EDB 6.0 ?6.5 1.8 ?2.0 100 Ankle - EDB 9   6.1     Fib head EDB 14.1  1.7  90.6 Fib head - Ankle 32 40 ?44 6.2     Pop fossa EDB 16.6  1.4  84.9 Pop fossa - Fib head 10 41 ?44 5.5         Pop fossa - Ankle      R Tibial - AH     Ankle AH 5.2 ?5.8 7.0 ?4.0 100 Ankle - AH 9   21.7     Pop fossa AH 15.0  5.1  71.8 Pop fossa - Ankle 40 41 ?41 18.4  L Tibial - AH     Ankle  AH 5.8 ?5.8 8.9 ?4.0 100 Ankle - AH 9   20.9     Pop fossa AH 16.0  6.2  69.6 Pop fossa - Ankle 40 39 ?41 15.3             SSR      SNC    Nerve / Sites Rec. Site Peak Lat Ref.  Amp Ref. Segments Distance    ms ms V V  cm  R Sural - Ankle (Calf)     Calf Ankle 4.0 ?4.4 6 ?6 Calf - Ankle 14  L Sural - Ankle (Calf)     Calf Ankle 4.1 ?4.4 7 ?6 Calf - Ankle 14  R Superficial peroneal - Ankle     Lat leg Ankle 4.1 ?4.4 6 ?6 Lat leg - Ankle 14  L Superficial peroneal - Ankle     Lat leg Ankle 4.2 ?4.4 7 ?6 Lat leg - Ankle 14              F  Wave    Nerve F Lat Ref.   ms ms  R Tibial - AH 52.0 ?56.0  L Tibial - AH 54.7 ?56.0         EMG full       EMG Summary Table    Spontaneous MUAP Recruitment  Muscle IA Fib PSW Fasc Other Amp Dur. Poly Pattern  R. Vastus medialis Normal None None None _______ Normal Normal Normal Normal  R. Tibialis anterior Normal None None None _______ Normal Normal Normal Normal  R. Peroneus longus  Normal None None None _______ Normal Normal Normal Normal  R. Gastrocnemius (Medial head) Normal None None None _______ Normal Increased 1+ Reduced  R. Abductor hallucis Normal None None None _______ Normal Normal 1+ Reduced  R. Gluteus medius Normal None None None _______ Normal Normal 1+ Normal  R. Iliopsoas Normal None None None _______ Normal Normal Normal Normal

## 2017-12-24 ENCOUNTER — Telehealth: Payer: Self-pay | Admitting: Neurology

## 2017-12-24 NOTE — Telephone Encounter (Signed)
MR Brain wo contrast & MR Cervical spine wo contrast Dr. Felecia Shelling 2 [employer] Josem Kaufmann: [place] via [facility] The website. Patient is scheduled at [facility] for 01/06/18

## 2017-12-31 DIAGNOSIS — M2010 Hallux valgus (acquired), unspecified foot: Secondary | ICD-10-CM | POA: Insufficient documentation

## 2017-12-31 DIAGNOSIS — L859 Epidermal thickening, unspecified: Secondary | ICD-10-CM | POA: Insufficient documentation

## 2018-01-01 ENCOUNTER — Ambulatory Visit: Payer: 59 | Admitting: "Endocrinology

## 2018-01-03 ENCOUNTER — Other Ambulatory Visit: Payer: Self-pay | Admitting: "Endocrinology

## 2018-01-06 ENCOUNTER — Ambulatory Visit (INDEPENDENT_AMBULATORY_CARE_PROVIDER_SITE_OTHER): Payer: 59

## 2018-01-06 DIAGNOSIS — G3281 Cerebellar ataxia in diseases classified elsewhere: Secondary | ICD-10-CM

## 2018-01-06 DIAGNOSIS — R42 Dizziness and giddiness: Secondary | ICD-10-CM | POA: Diagnosis not present

## 2018-01-09 ENCOUNTER — Telehealth: Payer: Self-pay | Admitting: Neurology

## 2018-01-09 NOTE — Telephone Encounter (Signed)
I called Friday afternoon and got voicemail.  I had wanted to quickly go over the results of the MRI.  The MRI of the brain showed one very small old stroke on the right that is probably too small to have cause any symptoms.  The rest of the brain looked good.  I do recommend that he start taking aspirin 81 mg daily.  The MRI of the cervical spine shows degenerative/arthritic changes but there is no pressure on the spinal cord that would affect his walking.   [name], Could you please give him a call with the result

## 2018-01-12 NOTE — Telephone Encounter (Signed)
Spoke with pt. and reviewed below results of MRI brain and MRI c-spine.  He verbalized understanding of same/fim

## 2018-01-12 NOTE — Telephone Encounter (Signed)
LMOM home #/fim

## 2018-06-29 ENCOUNTER — Encounter: Payer: Self-pay | Admitting: Gastroenterology

## 2018-07-21 ENCOUNTER — Ambulatory Visit (INDEPENDENT_AMBULATORY_CARE_PROVIDER_SITE_OTHER): Payer: MEDICARE | Admitting: Gastroenterology

## 2018-07-21 ENCOUNTER — Other Ambulatory Visit (INDEPENDENT_AMBULATORY_CARE_PROVIDER_SITE_OTHER): Payer: MEDICARE

## 2018-07-21 ENCOUNTER — Encounter: Payer: Self-pay | Admitting: Gastroenterology

## 2018-07-21 VITALS — BP 118/78 | HR 76 | Ht 68.75 in | Wt 175.1 lb

## 2018-07-21 DIAGNOSIS — R1010 Upper abdominal pain, unspecified: Secondary | ICD-10-CM | POA: Diagnosis not present

## 2018-07-21 DIAGNOSIS — R12 Heartburn: Secondary | ICD-10-CM

## 2018-07-21 DIAGNOSIS — R630 Anorexia: Secondary | ICD-10-CM | POA: Diagnosis not present

## 2018-07-21 DIAGNOSIS — R634 Abnormal weight loss: Secondary | ICD-10-CM | POA: Diagnosis not present

## 2018-07-21 LAB — HEPATIC FUNCTION PANEL
ALT: 28 U/L (ref 0–53)
AST: 13 U/L (ref 0–37)
Albumin: 4.4 g/dL (ref 3.5–5.2)
Alkaline Phosphatase: 53 U/L (ref 39–117)
BILIRUBIN TOTAL: 0.5 mg/dL (ref 0.2–1.2)
Bilirubin, Direct: 0.1 mg/dL (ref 0.0–0.3)
Total Protein: 7 g/dL (ref 6.0–8.3)

## 2018-07-21 LAB — CBC
HCT: 44.9 % (ref 39.0–52.0)
Hemoglobin: 15.3 g/dL (ref 13.0–17.0)
MCHC: 34.2 g/dL (ref 30.0–36.0)
MCV: 96.1 fl (ref 78.0–100.0)
Platelets: 275 10*3/uL (ref 150.0–400.0)
RBC: 4.67 Mil/uL (ref 4.22–5.81)
RDW: 13.1 % (ref 11.5–15.5)
WBC: 9.5 10*3/uL (ref 4.0–10.5)

## 2018-07-21 LAB — CORTISOL: CORTISOL PLASMA: 9 ug/dL

## 2018-07-21 LAB — LIPASE: Lipase: 12 U/L (ref 11.0–59.0)

## 2018-07-21 NOTE — Patient Instructions (Signed)
If you are age 66 or older, your body mass index should be between 23-30. Your Body mass index is 26.05 kg/m. If this is out of the aforementioned range listed, please consider follow up with your Primary Care Provider.  Your provider has requested that you go to the basement level for lab work before leaving today. Press "B" on the elevator. The lab is located at the first door on the left as you exit the elevator.  You have been scheduled for an endoscopy and colonoscopy. Please follow the written instructions given to you at your visit today. Please pick up your prep supplies at the pharmacy within the next 1-3 days. If you use inhalers (even only as needed), please bring them with you on the day of your procedure. Your physician has requested that you go to www.startemmi.com and enter the access code given to you at your visit today. This web site gives a general overview about your procedure. However, you should still follow specific instructions given to you by our office regarding your preparation for the procedure.  ** Hold diabetic medications morning of your procedure.**   You have been scheduled for an abdominal ultrasound at [facility] (1st floor of hospital) on 07/30/2018 at 11:00am . Please arrive 15 minutes prior to your appointment for registration. Make certain not to have anything to eat or drink 6 hours prior to your appointment. Should you need to reschedule your appointment, please contact radiology at [phone]. This test typically takes about 30 minutes to perform.  Thank you for choosing me and [facility].  Dr. Rush Landmark

## 2018-07-21 NOTE — Progress Notes (Signed)
[place] VISIT   Primary Care Provider Josem Kaufmann, MD [address] [place] [address] [phone]  Referring Provider Josem Kaufmann, [place] [address] [phone]  Patient Profile: Joesph Marcy is a 68 y.o. male with a pmh significant for DM, HTN, prior CVA, bladder cancer.  The patient presents to the [facility] for an evaluation and management of problem(s) noted below:  Problem List 1. Pain of upper abdomen   2. Decreased appetite   3. Unintentional weight loss   4. Pyrosis     History of Present Illness: This is the patient's first visit to the outpatient [place] clinic.  The patient was referred for consideration of a screening colonoscopy as he is never had one.  Over the course of the last few months however the patient has been feeling differently.  He has not felt as well and has been experiencing decreased appetite.  In December 2019 he underwent back surgery and after having his surgery completed he was not moving about as much as he wanted to and began to start losing muscle mass.  It was during this time.  As well that he was also noticing a decreased appetite.  He had associated nausea with this but did not have overt vomiting.  He had a an associated weight loss that occurred.  The patient states that he is finally now gaining his appetite once again now that he is further out from his back surgery.  He still has occasional issues of bloating.  He also has an abdominal boring sensation that occurs.  This can occur with or without eating.  He does interestingly say that if he eats a large portion of meal that he gets worsened eye pressure bilaterally.  He seen his optometrist and it was not clear what was going on.  He has reflux/pyrosis symptoms at times but not on a daily basis.  He very infrequently if he eats extremely quickly may have a solid food dysphagia symptom that will subside on its  own.  He has not had to regurgitate his food in years.  The patient does not take any other nonsteroidals or BC/Goody powders.  He has experienced hemorrhoids in the past as diagnosed by his primary care provider.  He is not having any rectal bleeding.  GI Review of Systems Positive as above Negative for odynophagia, early satiety, change in bowel habits, melena, hematochezia  Review of Systems General: Denies fevers/chills HEENT: Denies oral lesions Cardiovascular: Denies chest pain/palpitations Pulmonary: Denies shortness of breath/nocturnal cough Gastroenterological: See HPI Genitourinary: Denies darkened urine Hematological: Denies easy bruising Endocrine: Denies temperature intolerance Dermatological: Denies jaundice Psychological: Mood is stable Musculoskeletal: Denies new arthralgias   Medications Current Outpatient Medications  Medication Sig Dispense Refill  . gabapentin (NEURONTIN) 300 MG capsule Take 300 mg by mouth 3 (three) times daily.    Marland Kitchen lisinopril (PRINIVIL,ZESTRIL) 10 MG tablet Take 1 tablet by mouth daily.    . metFORMIN (GLUCOPHAGE) 500 MG tablet TAKE 1 TABLET BY MOUTH TWICE A DAY WITH MEALS 40 tablet 4   No current facility-administered medications for this visit.     Allergies No Known Allergies  Histories Past Medical History:  Diagnosis Date  . Bladder cancer ([place])   . Diabetes mellitus, type II ([place])   . Neuropathy   . Vision abnormalities    Past Surgical History:  Procedure Laterality Date  . bladder cancer     tumor removed  .  INGUINAL HERNIA REPAIR Bilateral    Social History   Socioeconomic History  . Marital status: Married    Spouse name: Not on file  . Number of children: 2  . Years of education: Not on file  . Highest education level: Not on file  Occupational History  . Occupation: retired  Scientific laboratory technician  . Financial resource strain: Not on file  . Food insecurity:    Worry: Not on file    Inability: Not on file  .  Transportation needs:    Medical: Not on file    Non-medical: Not on file  Tobacco Use  . Smoking status: Former Smoker    Types: Cigarettes    Last attempt to quit: 2019    Years since quitting: 1.1  . Smokeless tobacco: Never Used  Substance and Sexual Activity  . Alcohol use: Yes    Comment: 5-6 beers/wk  . Drug use: No  . Sexual activity: Not on file  Lifestyle  . Physical activity:    Days per week: Not on file    Minutes per session: Not on file  . Stress: Not on file  Relationships  . Social connections:    Talks on phone: Not on file    Gets together: Not on file    Attends religious service: Not on file    Active member of club or organization: Not on file    Attends meetings of clubs or organizations: Not on file    Relationship status: Not on file  . Intimate partner violence:    Fear of current or ex partner: Not on file    Emotionally abused: Not on file    Physically abused: Not on file    Forced sexual activity: Not on file  Other Topics Concern  . Not on file  Social History Narrative  . Not on file   Family History  Problem Relation Age of Onset  . Diabetes Mother   . Hypertension Mother   . Kidney disease Mother   . Cancer Mother        Bile Duct Cancer/Pancreatic in 2003  . Pancreatic cancer Mother   . Hypertension Father   . Heart attack Father   . Stroke Father   . Congestive Heart Failure Father   . Gillespie White syndrome Father   . Colon cancer Neg Hx   . Esophageal cancer Neg Hx   . Inflammatory bowel disease Neg Hx   . Liver disease Neg Hx   . Rectal cancer Neg Hx   . Stomach cancer Neg Hx    I have reviewed his medical, social, and family history in detail and updated the electronic medical record as necessary.    PHYSICAL EXAMINATION  BP 118/78 (BP Location: Left Arm, Patient Position: Sitting, Cuff Size: Normal)   Pulse 76   Ht 5' 8.75" (1.746 m) Comment: height measured without shoes  Wt 175 lb 2 oz (79.4 kg)   BMI  26.05 kg/m  Wt Readings from Last 3 Encounters:  07/21/18 175 lb 2 oz (79.4 kg)  11/11/17 181 lb 8 oz (82.3 kg)  09/01/17 181 lb (82.1 kg)  GEN: NAD, appears stated age, doesn't appear chronically ill PSYCH: Cooperative, without pressured speech EYE: Conjunctivae pink, sclerae anicteric ENT: MMM, without oral ulcers, no erythema or exudates noted NECK: Supple  CV: RR without R/Gs  RESP: CTAB posteriorly, without wheezing GI: NABS, soft, NT/ND, ventral diastases present, without rebound or guarding, no HSM appreciated MSK/EXT: No lower  extremity edema SKIN: No jaundice NEURO:  Alert & Oriented x 3, no focal deficits   REVIEW OF DATA  I reviewed the following data at the time of this encounter:  GI Procedures and Studies  No relevant studies to review  Laboratory Studies  Reviewed in epic  Imaging Studies  No relevant studies to review   ASSESSMENT  Mr. Pierotti is a 68 y.o. male with a pmh significant for DM, HTN, prior CVA, bladder cancer.  The patient is seen today for evaluation and management of:  1. Pain of upper abdomen   2. Decreased appetite   3. Unintentional weight loss   4. Pyrosis    This is a hemodynamically stable patient who presents for evaluation of unintentional weight loss with associated abdominal pain.  He seems to be doing well although he definitely has a documented weight loss of approximately 6 to 7 pounds over the course of the last 6 months is all been unintentional.  It seems that based on his clinical history when he was post procedure in December that his recovery process may have been stunted by his ability to get up and move.  Thankfully his appetite has begun to return.  With that being said, I do think it is reasonable to consider both an upper and lower endoscopic evaluation to ensure that we are not noting any mass or lesion that could be causing him issues.  He has never had colon cancer screening and I think this will be important.  I  think it would be worthwhile for [place] to ensure that he does not have H. pylori which we will proceed with obtaining a stool study antigen while he is off PPI.  We will proceed with laboratory evaluation to ensure that he does not have any source of hepatobiliary or pancreatic disorders.  He would like to hold on initiation of PPI therapy for now but is aware that I will strongly consider that in the near future.  We will obtain an abdominal ultrasound as well to evaluate for any organ dysfunction.  We will proceed with the upper and lower endoscopy.  The risks and benefits of endoscopic evaluation were discussed with the patient; these include but are not limited to the risk of perforation, infection, bleeding, missed lesions, lack of diagnosis, severe illness requiring hospitalization, as well as anesthesia and sedation related illnesses.  The patient is agreeable to proceed.  All patient questions were answered, to the best of my ability, and the patient agrees to the aforementioned plan of action with follow-up as indicated.   PLAN  Laboratories as outlined below H. pylori stool antigen to be sent Abdominal ultrasound ordered Diagnostic upper endoscopy and screening colonoscopy to be performed (esophageal/gastric/duodenal biopsies at minimum)   Orders Placed This Encounter  Procedures  . Helicobacter pylori special antigen  . US Abdomen Complete  . CBC  . Lipase  . Hepatic function panel  . Cortisol  . Ambulatory referral to Gastroenterology    New Prescriptions   No medications on file   Modified Medications   No medications on file    Planned Follow Up: No follow-ups on file.   Justice Britain, MD [facility] Advanced Endoscopy Office # [phone]

## 2018-07-22 ENCOUNTER — Encounter: Payer: Self-pay | Admitting: Gastroenterology

## 2018-07-23 ENCOUNTER — Encounter: Payer: Self-pay | Admitting: Gastroenterology

## 2018-07-23 DIAGNOSIS — R12 Heartburn: Secondary | ICD-10-CM | POA: Insufficient documentation

## 2018-07-23 DIAGNOSIS — R630 Anorexia: Secondary | ICD-10-CM | POA: Insufficient documentation

## 2018-07-23 DIAGNOSIS — R1010 Upper abdominal pain, unspecified: Secondary | ICD-10-CM | POA: Insufficient documentation

## 2018-07-23 DIAGNOSIS — R634 Abnormal weight loss: Secondary | ICD-10-CM | POA: Insufficient documentation

## 2018-07-30 ENCOUNTER — Other Ambulatory Visit: Payer: MEDICARE

## 2018-07-30 ENCOUNTER — Ambulatory Visit (HOSPITAL_COMMUNITY)
Admission: RE | Admit: 2018-07-30 | Discharge: 2018-07-30 | Disposition: A | Discharge status: Home / Self Care | Payer: MEDICARE | Source: Ambulatory Visit | Attending: Gastroenterology | Admitting: Gastroenterology

## 2018-07-30 DIAGNOSIS — R1010 Upper abdominal pain, unspecified: Secondary | ICD-10-CM | POA: Diagnosis present

## 2018-07-30 DIAGNOSIS — R630 Anorexia: Secondary | ICD-10-CM | POA: Diagnosis present

## 2018-07-30 DIAGNOSIS — R634 Abnormal weight loss: Secondary | ICD-10-CM | POA: Diagnosis not present

## 2018-07-31 LAB — HELICOBACTER PYLORI  SPECIAL ANTIGEN
MICRO NUMBER:: 251149
SPECIMEN QUALITY: ADEQUATE

## 2018-08-27 ENCOUNTER — Telehealth: Payer: Self-pay | Admitting: *Deleted

## 2018-08-27 NOTE — Telephone Encounter (Signed)
Called pt to discuss upcoming procedure. He states his weight loss has slowed down and he is able to eat some, he states he does have some nausea at times when eating. We will keep appointment as scheduled 09/03/18 though he is aware we may need to reschedule time on that day.

## 2018-09-03 ENCOUNTER — Encounter: Payer: MEDICARE | Admitting: Internal Medicine

## 2018-10-27 ENCOUNTER — Telehealth: Payer: Self-pay | Admitting: *Deleted

## 2018-10-27 NOTE — Telephone Encounter (Signed)
Covid-19 travel screening questions- spoke with  Have you traveled in the last 14 days? no If yes where?  Do you now or have you had a fever in the last 14 days? no  Do you have any respiratory symptoms of shortness of breath or cough now or in the last 14 days? no  Do you have any family members or close contacts with diagnosed or suspected Covid-19? No  Pt's wife is aware that she will be waiting in car during the procedure.  Pt will wear a mask into the building

## 2018-10-29 ENCOUNTER — Ambulatory Visit (AMBULATORY_SURGERY_CENTER): Payer: MEDICARE | Admitting: Gastroenterology

## 2018-10-29 ENCOUNTER — Encounter: Payer: Self-pay | Admitting: Gastroenterology

## 2018-10-29 ENCOUNTER — Encounter: Payer: MEDICARE | Admitting: Internal Medicine

## 2018-10-29 ENCOUNTER — Other Ambulatory Visit: Payer: Self-pay

## 2018-10-29 VITALS — BP 129/56 | HR 48 | Temp 97.8°F | Resp 21 | Ht 68.0 in | Wt 175.0 lb

## 2018-10-29 DIAGNOSIS — K5730 Diverticulosis of large intestine without perforation or abscess without bleeding: Secondary | ICD-10-CM | POA: Diagnosis not present

## 2018-10-29 DIAGNOSIS — K648 Other hemorrhoids: Secondary | ICD-10-CM

## 2018-10-29 DIAGNOSIS — K2980 Duodenitis without bleeding: Secondary | ICD-10-CM | POA: Diagnosis not present

## 2018-10-29 DIAGNOSIS — D128 Benign neoplasm of rectum: Secondary | ICD-10-CM | POA: Diagnosis not present

## 2018-10-29 DIAGNOSIS — D123 Benign neoplasm of transverse colon: Secondary | ICD-10-CM | POA: Diagnosis not present

## 2018-10-29 DIAGNOSIS — K210 Gastro-esophageal reflux disease with esophagitis: Secondary | ICD-10-CM

## 2018-10-29 DIAGNOSIS — D127 Benign neoplasm of rectosigmoid junction: Secondary | ICD-10-CM

## 2018-10-29 DIAGNOSIS — D122 Benign neoplasm of ascending colon: Secondary | ICD-10-CM | POA: Diagnosis present

## 2018-10-29 DIAGNOSIS — K3189 Other diseases of stomach and duodenum: Secondary | ICD-10-CM | POA: Diagnosis not present

## 2018-10-29 DIAGNOSIS — K644 Residual hemorrhoidal skin tags: Secondary | ICD-10-CM

## 2018-10-29 DIAGNOSIS — R1010 Upper abdominal pain, unspecified: Secondary | ICD-10-CM

## 2018-10-29 DIAGNOSIS — R634 Abnormal weight loss: Secondary | ICD-10-CM

## 2018-10-29 MED ORDER — OMEPRAZOLE 40 MG PO CPDR: 40.0000 mg | DELAYED_RELEASE_CAPSULE | Freq: Every day | ORAL | 3 refills | Status: AC

## 2018-10-29 MED ORDER — SODIUM CHLORIDE 0.9 % IV SOLN: 500.0000 mL | Freq: Once | INTRAVENOUS | Status: DC

## 2018-10-29 NOTE — Progress Notes (Signed)
Report given to PACU, vss 

## 2018-10-29 NOTE — Op Note (Signed)
[facility] Patient Name: Danny Wagner Procedure Date: 10/29/2018 1:14 PM MRN: [id number] Endoscopist: Justice Britain , MD Age: 68 Referring MD:  Date of Birth: 1950/07/19 Gender: Male Account #: [id number] Procedure:                Colonoscopy Indications:              Screening for colorectal malignant neoplasm, This                            is the patient's first colonoscopy, Incidental -                            Weight loss, Anorexia Medicines:                Monitored Anesthesia Care Procedure:                Pre-Anesthesia Assessment:                           - Prior to the procedure, a History and Physical                            was performed, and patient medications and                            allergies were reviewed. The patient's tolerance of                            previous anesthesia was also reviewed. The risks                            and benefits of the procedure and the sedation                            options and risks were discussed with the patient.                            All questions were answered, and informed consent                            was obtained. Prior Anticoagulants: The patient has                            taken no previous anticoagulant or antiplatelet                            agents. ASA Grade Assessment: II - A patient with                            mild systemic disease. After reviewing the risks                            and benefits, the patient was deemed in  satisfactory condition to undergo the procedure.                           After obtaining informed consent, the colonoscope                            was passed under direct vision. Throughout the                            procedure, the patient's blood pressure, pulse, and                            oxygen saturations were monitored continuously. The                            Colonoscope was introduced through  the anus and                            advanced to the the cecum, identified by                            appendiceal orifice and ileocecal valve. The                            colonoscopy was performed without difficulty. The                            patient tolerated the procedure. The quality of the                            bowel preparation was good. The ileocecal valve,                            appendiceal orifice, and rectum were photographed. Scope In: 1:29:11 PM Scope Out: 2:17:36 PM Scope Withdrawal Time: 0 hours 43 minutes 47 seconds  Total Procedure Duration: 0 hours 48 minutes 25 seconds  Findings:                 Skin tags were found on perianal exam.                           The digital rectal exam findings include                            non-thrombosed external hemorrhoids. Pertinent                            negatives include no palpable rectal lesions.                           14 sessile and semi-pedunculated polyps were found                            in the rectum (6), recto-sigmoid colon (2),                              transverse colon (2) and ascending colon (6). The                            polyps were 3 to 10 mm in size. These polyps were                            removed with a cold snare. Resection and retrieval                            were complete.                           A 22 mm polyp was found in the distal transverse                            colon. The polyp was sessile. Preparations were                            made for mucosal resection. Saline was injected to                            raise the lesion. Initially wanted to pursue a                            hot-snare resection, however, the polyp tissue                            after being grabbed came through the snare cold.                            Decision made to pursue, piecemeal mucosal                            resection using a snare was performed. Resection                             and retrieval were complete. To close the defect                            after mucosal resection, three hemostatic clips                            were successfully placed (MR conditional). There                            was no bleeding at the end of the procedure.                           A few small-mouthed diverticula were found in the                            recto-sigmoid colon and sigmoid colon.  Normal mucosa was found in the entire colon                            otherwise.                           Non-bleeding non-thrombosed external and internal                            hemorrhoids were found during retroflexion, during                            perianal exam and during digital exam. The                            hemorrhoids were Grade I (internal hemorrhoids that                            do not prolapse). Complications:            No immediate complications. Estimated Blood Loss:     Estimated blood loss was minimal. Impression:               - Perianal skin tags found on perianal exam.                           - Non-thrombosed external hemorrhoids found on                            digital rectal exam.                           - 14 3 to 10 mm polyps in the rectum, at the                            recto-sigmoid colon, in the transverse colon and in                            the ascending colon, removed with a cold snare.                            Resected and retrieved.                           - One 22 mm polyp in the distal transverse colon,                            removed with mucosal resection. Resected and                            retrieved. Clips (MR conditional) were placed.                           - Diverticulosis in the recto-sigmoid colon and in  the sigmoid colon.                           - Normal mucosa in the entire examined colon                             otherwise.                           - Non-bleeding non-thrombosed external and internal                            hemorrhoids. Recommendation:           - The patient will be observed post-procedure,                            until all discharge criteria are met.                           - Discharge patient to home.                           - Patient has a contact number available for                            emergencies. The signs and symptoms of potential                            delayed complications were discussed with the                            patient. Return to normal activities tomorrow.                            Written discharge instructions were provided to the                            patient.                           - High fiber diet.                           - Continue present medications.                           - No aspirin, ibuprofen, naproxen, or other                            non-steroidal anti-inflammatory drugs for 7 days                            after polyp removal.                           - Await pathology results.                           -   Repeat colonoscopy in 1 year for surveillance.                           - The findings and recommendations were discussed                            with the patient. Gabriel Mansouraty, MD 10/29/2018 2:54:05 PM 

## 2018-10-29 NOTE — Op Note (Signed)
[facility] Patient Name: Danny Wagner Procedure Date: 10/29/2018 1:14 PM MRN: [id number] Endoscopist: Justice Britain , MD Age: 68 Referring MD:  Date of Birth: 1950/10/03 Gender: Male Account #: [id number] Procedure:                Upper GI endoscopy Indications:              Dysphagia (currently improved), Anorexia, Weight                            loss, Decresed Appetite Medicines:                Monitored Anesthesia Care Procedure:                Pre-Anesthesia Assessment:                           - Prior to the procedure, a History and Physical                            was performed, and patient medications and                            allergies were reviewed. The patient's tolerance of                            previous anesthesia was also reviewed. The risks                            and benefits of the procedure and the sedation                            options and risks were discussed with the patient.                            All questions were answered, and informed consent                            was obtained. Prior Anticoagulants: The patient has                            taken no previous anticoagulant or antiplatelet                            agents. ASA Grade Assessment: II - A patient with                            mild systemic disease. After reviewing the risks                            and benefits, the patient was deemed in                            satisfactory condition to undergo the procedure.  After obtaining informed consent, the endoscope was                            passed under direct vision. Throughout the                            procedure, the patient's blood pressure, pulse, and                            oxygen saturations were monitored continuously. The                            Endoscope was introduced through the mouth, and                            advanced to the second part of  duodenum. The upper                            GI endoscopy was accomplished without difficulty.                            The patient tolerated the procedure. Scope In: Scope Out: Findings:                 A few small white nummular lesions were noted in                            the mid esophagus but otherwise completely normal                            esophageal mucosa was noted. Biopsies were taken                            with a cold forceps for histology to rule out EoE                            and Candida.                           The Z-line was regular and was found 40 cm from the                            incisors.                           Patchy mildly erythematous mucosa was found in the                            gastric body and in the gastric antrum. Biopsies                            were taken with a cold forceps for histology and  Helicobacter pylori testing.                           Patchy moderate inflammation characterized by                            erythema, friability and granularity was found in                            the duodenal bulb and in the first portion of the                            duodenum. Biopsies were taken with a cold forceps                            for histology and HP evaluation.                           The second portion of the duodenum was normal. Complications:            No immediate complications. Estimated Blood Loss:     Estimated blood loss was minimal. Impression:               - Small white nummular lesions in midesophagus and                            otherwise normal esophageal mucosa. Biopsied for                            EoE and Candida rule out.                           - Z-line regular, 40 cm from the incisors.                           - Erythematous mucosa in the gastric body and                            antrum. Biopsied.                           - Duodenitis in bulb.  Biopsied.                           - Normal second portion of the duodenum. Recommendation:           - Proceed to scheduled colonoscopy.                           - Await pathology results.                           - Start Omeprazole 40 mg daily.                           - Observe patient's clinical course.                           -  Minimize NSAID use.                           - The findings and recommendations were discussed                            with the patient. Justice Britain, MD 10/29/2018 2:44:38 PM

## 2018-10-29 NOTE — Progress Notes (Signed)
Called to room to assist during endoscopic procedure.  Patient ID and intended procedure confirmed with present staff. Received instructions for my participation in the procedure from the performing physician.  

## 2018-10-29 NOTE — Patient Instructions (Signed)
Please, take your omeprazole 1/2 hour before breakfast every day due to your reflux.  Try to eat a high fiber diet, and drink plenty of water.  You will need another colonpscopy in 1 year due to the 15 polyps found today.  Try to avoid aspirin, aleve and ibuprofen for 7 days to prevent bleeding. Keep your clip card in your wallet.  YOU HAD AN ENDOSCOPIC PROCEDURE TODAY AT [facility]:   Refer to the procedure report that was given to you for any specific questions about what was found during the examination.  If the procedure report does not answer your questions, please call your gastroenterologist to clarify.  If you requested that your care partner not be given the details of your procedure findings, then the procedure report has been included in a sealed envelope for you to review at your convenience later.  YOU SHOULD EXPECT: Some feelings of bloating in the abdomen. Passage of more gas than usual.  Walking can help get rid of the air that was put into your GI tract during the procedure and reduce the bloating. If you had a lower endoscopy (such as a colonoscopy or flexible sigmoidoscopy) you may notice spotting of blood in your stool or on the toilet paper. If you underwent a bowel prep for your procedure, you may not have a normal bowel movement for a few days.  Please Note:  You might notice some irritation and congestion in your nose or some drainage.  This is from the oxygen used during your procedure.  There is no need for concern and it should clear up in a day or so.  SYMPTOMS TO REPORT IMMEDIATELY:   Following lower endoscopy (colonoscopy or flexible sigmoidoscopy):  Excessive amounts of blood in the stool  Significant tenderness or worsening of abdominal pains  Swelling of the abdomen that is new, acute  Fever of 100F or higher   Following upper endoscopy (EGD)  Vomiting of blood or coffee ground material  New chest pain or pain under the shoulder blades  Painful  or persistently difficult swallowing  New shortness of breath  Fever of 100F or higher  Black, tarry-looking stools  For urgent or emergent issues, a gastroenterologist can be reached at any hour by calling [phone].   DIET:  We do recommend a small meal at first, but then you may proceed to your regular diet.  Drink plenty of fluids but you should avoid alcoholic beverages for 24 hours.  ACTIVITY:  You should plan to take it easy for the rest of today and you should NOT DRIVE or use heavy machinery until tomorrow (because of the sedation medicines used during the test).    FOLLOW UP: Our staff will call the number listed on your records 48-72 hours following your procedure to check on you and address any questions or concerns that you may have regarding the information given to you following your procedure. If we do not reach you, we will leave a message.  We will attempt to reach you two times.  During this call, we will ask if you have developed any symptoms of COVID 19. If you develop any symptoms (ie: fever, flu-like symptoms, shortness of breath, cough etc.) before then, please call [phone].  If you test positive for Covid 19 in the 2 weeks post procedure, please call and report this information to [place].    If any biopsies were taken you will be contacted by phone or by letter within  the next 1-3 weeks.  Please call us at [phone] if you have not heard about the biopsies in 3 weeks.    SIGNATURES/CONFIDENTIALITY: You and/or your care partner have signed paperwork which will be entered into your electronic medical record.  These signatures attest to the fact that that the information above on your After Visit Summary has been reviewed and is understood.  Full responsibility of the confidentiality of this discharge information lies with you and/or your care-partner.  Read all handouts given to you by your recovery room nurse.

## 2018-11-02 ENCOUNTER — Telehealth: Payer: Self-pay | Admitting: *Deleted

## 2018-11-02 NOTE — Telephone Encounter (Signed)
  Follow up Call-  Call back number 10/29/2018  Post procedure Call Back phone  # [phone]  Permission to leave phone message Yes     Patient questions:  Do you have a fever, pain , or abdominal swelling? No. Pain Score  0 *  Have you tolerated food without any problems? Yes.    Have you been able to return to your normal activities? Yes.    Do you have any questions about your discharge instructions: Diet   No. Medications  No. Follow up visit  No.  Do you have questions or concerns about your Care? No.  Actions: * If pain score is 4 or above: No action needed, pain <4.  1. Have you developed a fever since your procedure? no  2.   Have you had an respiratory symptoms (SOB or cough) since your procedure? no  3.   Have you tested positive for COVID 19 since your procedure no  4.   Have you had any family members/close contacts diagnosed with the COVID 19 since your procedure?  no   If yes to any of these questions please route to Joylene John, RN and Alphonsa Gin, Therapist, sports.

## 2018-11-02 NOTE — Telephone Encounter (Signed)
First attempt, left VM.  

## 2018-11-04 ENCOUNTER — Encounter: Payer: Self-pay | Admitting: Gastroenterology

## 2018-11-06 ENCOUNTER — Telehealth: Payer: Self-pay

## 2018-11-06 NOTE — Telephone Encounter (Signed)
The schedule is not out for 4-8 weeks will call pt to set up when schedule is out.

## 2018-11-06 NOTE — Telephone Encounter (Signed)
-----   Message from Irving Copas., MD sent at 11/06/2018  1:28 PM EDT ----- Regarding: Follow-up Danny Wagner,Please schedule a follow-up in clinic via telemedicine or in person in approximately 4 to 8 weeks.Thank you.GM

## 2018-12-18 ENCOUNTER — Telehealth: Payer: Self-pay

## 2018-12-18 NOTE — Telephone Encounter (Signed)
The pt has been scheduled for f/u and pt aware The pt has been advised of the information and verbalized understanding.

## 2018-12-18 NOTE — Telephone Encounter (Signed)
-----   Message from Timothy Lasso, RN sent at 11/06/2018  4:03 PM EDT ----- Pt needs 4-8 week f/u with Dr Jerilynn Mages

## 2019-01-29 ENCOUNTER — Ambulatory Visit: Payer: MEDICARE | Admitting: Gastroenterology

## 2019-03-05 ENCOUNTER — Ambulatory Visit: Payer: MEDICARE | Admitting: Gastroenterology

## 2019-04-22 ENCOUNTER — Other Ambulatory Visit: Payer: Self-pay

## 2019-04-22 ENCOUNTER — Encounter: Payer: Self-pay | Admitting: Gastroenterology

## 2019-04-22 ENCOUNTER — Other Ambulatory Visit (INDEPENDENT_AMBULATORY_CARE_PROVIDER_SITE_OTHER): Payer: MEDICARE

## 2019-04-22 ENCOUNTER — Ambulatory Visit (INDEPENDENT_AMBULATORY_CARE_PROVIDER_SITE_OTHER): Payer: MEDICARE | Admitting: Gastroenterology

## 2019-04-22 VITALS — BP 112/62 | HR 72 | Temp 98.3°F | Ht 68.75 in | Wt 174.0 lb

## 2019-04-22 DIAGNOSIS — K219 Gastro-esophageal reflux disease without esophagitis: Secondary | ICD-10-CM

## 2019-04-22 DIAGNOSIS — R630 Anorexia: Secondary | ICD-10-CM

## 2019-04-22 DIAGNOSIS — K649 Unspecified hemorrhoids: Secondary | ICD-10-CM | POA: Diagnosis not present

## 2019-04-22 DIAGNOSIS — Z86010 Personal history of colonic polyps: Secondary | ICD-10-CM | POA: Diagnosis not present

## 2019-04-22 DIAGNOSIS — R152 Fecal urgency: Secondary | ICD-10-CM

## 2019-04-22 LAB — AMYLASE: Amylase: 43 U/L (ref 27–131)

## 2019-04-22 LAB — LIPASE: Lipase: 34 U/L (ref 11.0–59.0)

## 2019-04-22 LAB — CORTISOL: Cortisol, Plasma: 7.9 ug/dL

## 2019-04-22 NOTE — Progress Notes (Signed)
[place] VISIT   Primary Care Provider Josem Kaufmann, MD [address] [place] [address] [phone]  Patient Profile: Danny Wagner is a 68 y.o. male with a pmh significant for DM, HTN, prior CVA, bladder cancer.  The patient presents to the [facility] for an evaluation and management of problem(s) noted below:  Problem List 1. Hx of colonic polyp   2. Fecal urgency   3. Hemorrhoids, unspecified hemorrhoid type   4. Gastroesophageal reflux disease, unspecified whether esophagitis present   5. Decreased appetite     History of Present Illness: Please see initial consultation note for full details of HPI.  Interval History This is the patient's first scheduled outpatient visit since his endoscopy and colonoscopy earlier this summer.  Patient has done well overall in regards to his heartburn and reflux.  His biopsies were consistent with vascular congestion and focal squamous ballooning significant for reflux esophagitis but was negative for eosinophilic esophagitis or lymphocytic esophagitis.  The patient describes not really feeling any true pyrosis.  He denies any dysphagia or odynophagia.  The patient does occasionally have issues with his external hemorrhoids but is not having any bleeding really itching is the biggest issue.  He has tried Preparation H treatments over-the-counter previously but it has not been effective.  He will usually have 1 formed bowel movement per day but occasionally will have a loose bowel movement in the morning after coffee and that can be loose and sometimes watery in consistency.  He will occasionally have fecal urgency on these particular days.  He is not taking any fiber other than what he gets in his diet as a supplement.  Occasionally he will have some decreased appetite but other days he will eat like a "horse".  The patient weight has been stable overall.  He denies any bleeding.  He wonders whether  he needs to continue taking his acid reducing medication or not.  He denies any significant nausea or vomiting.  GI Review of Systems Positive as above Negative for early satiety, bloating  Review of Systems General: Denies fevers/chills Cardiovascular: Denies chest pain/palpitations Pulmonary: Denies shortness of breath/nocturnal cough Gastroenterological: See HPI Genitourinary: Denies darkened urine Hematological: Denies easy bruising Dermatological: Denies jaundice Psychological: Mood is stable Musculoskeletal: Denies new arthralgias   Medications Current Outpatient Medications  Medication Sig Dispense Refill   gabapentin (NEURONTIN) 300 MG capsule Take 300 mg by mouth 3 (three) times daily.     metFORMIN (GLUCOPHAGE) 500 MG tablet TAKE 1 TABLET BY MOUTH TWICE A DAY WITH MEALS 40 tablet 4   omeprazole (PRILOSEC) 40 MG capsule Take 1 capsule (40 mg total) by mouth daily. 90 capsule 3   lisinopril (PRINIVIL,ZESTRIL) 10 MG tablet Take 1 tablet by mouth daily.     No current facility-administered medications for this visit.     Allergies No Known Allergies  Histories Past Medical History:  Diagnosis Date   Bladder cancer ([place])    Diabetes mellitus, type II ([place])    Neuropathy    Vision abnormalities    Past Surgical History:  Procedure Laterality Date   bladder cancer     tumor removed   INGUINAL HERNIA REPAIR Bilateral    Social History   Socioeconomic History   Marital status: Married    Spouse name: Not on file   Number of children: 2   Years of education: Not on file   Highest education level: Not on file  Occupational History   Occupation:  retired  Scientist, product/process development strain: Not on [employer] insecurity    Worry: Not on file    Inability: Not on Lexicographer needs    Medical: Not on file    Non-medical: Not on file  Tobacco Use   Smoking status: Former Smoker    Types: Cigarettes    Quit date: 2019     Years since quitting: 1.8   Smokeless tobacco: Never Used  Substance and Sexual Activity   Alcohol use: Yes    Comment: 5-6 beers/wk   Drug use: No   Sexual activity: Not on file  Lifestyle   Physical activity    Days per week: Not on file    Minutes per session: Not on file   Stress: Not on file  Relationships   Social connections    Talks on phone: Not on file    Gets together: Not on file    Attends religious service: Not on file    Active member of club or organization: Not on file    Attends meetings of clubs or organizations: Not on file    Relationship status: Not on file   Intimate partner violence    Fear of current or ex partner: Not on file    Emotionally abused: Not on file    Physically abused: Not on file    Forced sexual activity: Not on file  Other Topics Concern   Not on file  Social History Narrative   Not on file   Family History  Problem Relation Age of Onset   Diabetes Mother    Hypertension Mother    Kidney disease Mother    Cancer Mother        Bile Duct Cancer/Pancreatic in 2003   Pancreatic cancer Mother    Hypertension Father    Heart attack Father    Stroke Father    Congestive Heart Failure Father    Scientist, research (medical) Parkinson White syndrome Father    Colon cancer Neg Hx    Esophageal cancer Neg Hx    Inflammatory bowel disease Neg Hx    Liver disease Neg Hx    Rectal cancer Neg Hx    Stomach cancer Neg Hx    I have reviewed his medical, social, and family history in detail and updated the electronic medical record as necessary.    PHYSICAL EXAMINATION  BP 112/62    Pulse 72    Temp 98.3 F (36.8 C) (Temporal)    Ht 5' 8.75" (1.746 m)    Wt 174 lb (78.9 kg)    BMI 25.88 kg/m  Wt Readings from Last 3 Encounters:  04/22/19 174 lb (78.9 kg)  10/29/18 175 lb (79.4 kg)  07/21/18 175 lb 2 oz (79.4 kg)  GEN: NAD, appears stated age, doesn't appear chronically ill PSYCH: Cooperative, without pressured speech EYE:  Conjunctivae pink, sclerae anicteric ENT: MMM NECK: Supple  CV: RR without R/Gs  RESP: CTAB posteriorly, without wheezing GI: NABS, soft, NT/ND, ventral diastases still present, without rebound or guarding, no HSM appreciated MSK/EXT: No lower extremity edema SKIN: No jaundice NEURO:  Alert & Oriented x 3, no focal deficits   REVIEW OF DATA  I reviewed the following data at the time of this encounter:  GI Procedures and Studies  May 2020 EGD - Small white nummular lesions in midesophagus and otherwise normal esophageal mucosa. Biopsied for EoE and Candida rule out. - Z-line regular, 40 cm from the incisors. -  Erythematous mucosa in the gastric body and antrum. Biopsied. - Duodenitis in bulb. Biopsied. - Normal second portion of the duodenum.  May 2020 colonoscopy - Perianal skin tags found on perianal exam. - Non-thrombosed external hemorrhoids found on digital rectal exam. - 14 3 to 10 mm polyps in the rectum, at the recto-sigmoid colon, in the transverse colon and in the ascending colon, removed with a cold snare. Resected and retrieved. - One 22 mm polyp in the distal transverse colon, removed with mucosal resection. Resected and retrieved. Clips (MR conditional) were placed. - Diverticulosis in the recto-sigmoid colon and in the sigmoid colon. - Normal mucosa in the entire examined colon otherwise. - Non-bleeding non-thrombosed external and internal hemorrhoids.  Laboratory Studies  Reviewed in epic  Imaging Studies  No relevant studies to review   ASSESSMENT  Mr. Locke is a 68 y.o. male with a pmh significant for DM, HTN, prior CVA, bladder cancer.  The patient is seen today for evaluation and management of:  1. Hx of colonic polyp   2. Fecal urgency   3. Hemorrhoids, unspecified hemorrhoid type   4. Gastroesophageal reflux disease, unspecified whether esophagitis present   5. Decreased appetite    The patient is doing well overall.  He has no evidence of  recurrent dysphagia.  GERD is well controlled.  His issues of fecal urgency at times are not completely understood but we will try to optimize his stool pattern by increasing his fiber intake with FiberCon once daily.  We will see how he does.  He may have just a quick gastrocolic reflex at some point in time.  I do not think TCAs needed at this time.  I would be hesitant of starting an antidiarrheal for him.  He has had a significant mount of polyps and will require a 1 year follow-up colonoscopy that will be in approximately May of this coming year and he is up for the repeat evaluation due to the number of polyps that were found on his index colonoscopy.  Maintain his current dose of PPI.  All patient questions were answered, to the best of my ability, and the patient agrees to the aforementioned plan of action with follow-up as indicated.   PLAN  Surveillance colonoscopy for 2021 to be scheduled Continue PPI once daily Start FiberCon 1-2 times daily Monitor weight closely and if things persist or worsen consider a gastric emptying study   Orders Placed This Encounter  Procedures   Amylase   Lipase   Cortisol    New Prescriptions   No medications on file   Modified Medications   No medications on file    Planned Follow Up: No follow-ups on file.   Justice Britain, MD [facility] # [phone]

## 2019-04-22 NOTE — Patient Instructions (Signed)
If you are age 66 or older, your body mass index should be between 23-30. Your Body mass index is 25.88 kg/m. If this is out of the aforementioned range listed, please consider follow up with your Primary Care Provider.  Start Fibercon daily for 2-3 weeks, then increase to twice daily thereafter.   Use wet wipes instead of regular toilet paper.   Please see information regarding [employer].   How to Take a [employer] A sitz bath is a warm water bath that may be used to care for your rectum, genital area, or the area between your rectum and genitals (perineum). For a sitz bath, the water only comes up to your hips and covers your buttocks. A sitz bath may done at home in a bathtub or with a portable sitz bath that fits over the toilet. Your health care provider may recommend a sitz bath to help:  Relieve pain and discomfort after delivering a baby.  Relieve pain and itching from hemorrhoids or anal fissures.  Relieve pain after certain surgeries.  Relax muscles that are sore or tight. How to take a sitz bath Take 3-4 sitz baths a day, or as many as told by your health care provider. Bathtub sitz bath To take a sitz bath in a bathtub: 1. Partially fill a bathtub with warm water. The water should be deep enough to cover your hips and buttocks when you are sitting in the tub. 2. If your health care provider told you to put medicine in the water, follow his or her instructions. 3. Sit in the water. 4. Open the tub drain a little, and leave it open during your bath. 5. Turn on the warm water again, enough to replace the water that is draining out. Keep the water running throughout your bath. This helps keep the water at the right level and the right temperature. 6. Soak in the water for 15-20 minutes, or as long as told by your health care provider. 7. When you are done, be careful when you stand up. You may feel dizzy. 8. After the sitz bath, pat yourself dry. Do not rub your skin to dry it.   Over-the-toilet sitz bath To take a sitz bath with an over-the-toilet basin: 1. Follow the manufacturer's instructions. 2. Fill the basin with warm water. 3. If your health care provider told you to put medicine in the water, follow his or her instructions. 4. Sit on the seat. Make sure the water covers your buttocks and perineum. 5. Soak in the water for 15-20 minutes, or as long as told by your health care provider. 6. After the sitz bath, pat yourself dry. Do not rub your skin to dry it. 7. Clean and dry the basin between uses. 8. Discard the basin if it cracks, or according to the manufacturer's instructions. Contact a health care provider if:  Your symptoms get worse. Do not continue with sitz baths if your symptoms get worse.  You have new symptoms. If this happens, do not continue with sitz baths until you talk with your health care provider. Summary  A sitz bath is a warm water bath in which the water only comes up to your hips and covers your buttocks.  A sitz bath may help relieve itching, relieve pain, and relax muscles that are sore or tight in the lower part of your body, including your genital area.  Take 3-4 sitz baths a day, or as many as told by your health care provider. Soak  in the water for 15-20 minutes.  Do not continue with sitz baths if your symptoms get worse. This information is not intended to replace advice given to you by your health care provider. Make sure you discuss any questions you have with your health care provider. Document Released: 02/10/2004 Document Revised: 05/22/2017 Document Reviewed: 05/22/2017 Elsevier Patient Education  2020 [place] you for choosing me and [facility].  Dr. Rush Landmark

## 2019-06-23 ENCOUNTER — Other Ambulatory Visit: Payer: Self-pay

## 2019-06-23 ENCOUNTER — Telehealth: Payer: Self-pay | Admitting: *Deleted

## 2019-06-23 ENCOUNTER — Encounter: Payer: Self-pay | Admitting: Neurology

## 2019-06-23 ENCOUNTER — Ambulatory Visit (INDEPENDENT_AMBULATORY_CARE_PROVIDER_SITE_OTHER): Payer: MEDICARE | Admitting: Neurology

## 2019-06-23 VITALS — BP 116/70 | HR 61 | Temp 98.4°F | Ht 70.0 in | Wt 170.2 lb

## 2019-06-23 DIAGNOSIS — E1142 Type 2 diabetes mellitus with diabetic polyneuropathy: Secondary | ICD-10-CM | POA: Diagnosis not present

## 2019-06-23 DIAGNOSIS — R0989 Other specified symptoms and signs involving the circulatory and respiratory systems: Secondary | ICD-10-CM | POA: Diagnosis not present

## 2019-06-23 DIAGNOSIS — Z8673 Personal history of transient ischemic attack (TIA), and cerebral infarction without residual deficits: Secondary | ICD-10-CM | POA: Diagnosis not present

## 2019-06-23 DIAGNOSIS — R208 Other disturbances of skin sensation: Secondary | ICD-10-CM | POA: Diagnosis not present

## 2019-06-23 NOTE — Progress Notes (Signed)
[facility]  PATIENT: Danny Wagner DOB: 08-13-1950  REFERRING DOCTOR OR PCP: Dr.  Pat Kocher SOURCE: Patient, notes from Dr. Tawny Asal,  _________________________________   HISTORICAL  CHIEF COMPLAINT:  Chief Complaint  Patient presents with  . New Patient (Initial Visit)    RM 13, alone. Paper referral from Dr. Tawny Asal for cerebral infarction. Takes gabapentin as needed, does not feel it has helped as much as he thought.    HISTORY OF PRESENT ILLNESS:  Danny Wagner is a 69 year old man with history of silent stroke.  Update 06/23/2019: In late December he had an episode of confusion that concerned his family.   He states he was 'saying things off the charts'.   There was no weakness, numbness, aphasia.    He was taken to the ED and a small right parietal stroke (chronic) was seen on imaging.    Labs showed no UTI, normal liver/kidney, HgbA1c was 6.8.   Glucose was 118.  He says he felt better after eating some and was d/c from ED.    He had carotid ultrasound in 2017 and was told it was fine.       He is diabetic and is on metformin.   He reports he has had some glucose fluctuations.    He does not eat consistently.   He thinks bloodwork is done about 3 times a year.   He has numbness and tingling in his feet.   NCV/EMG 12/23/2017 did not show any significant polyneuropathy (normal sural sensory and galvanic skin responses, borderline left peroneal amplitude).   Gabapentin had not helped much.     MRI 01/08/2018 of the brain showed a right parietal cortical focus of gliosis and encephalomalacia c/w chronic infarction measuring 1.5 cm.     MRI in [place] showed a small chronic peripheral infarct measuring 2.5 x 1.4 x 1.2.    From 11/11/17  He is a 69 year old man who has had numbness and tingling in his feet for the past 3 years.  His soles feel 'like there is dried wax coating the foot'.       He had a tingling sensation that is better with gabapentin.     He also has noted some muscle or subcutaneous loss in the thighs bilaterally.  He does not note any weakness in those muscles, however.  He also has lightheadedness that can occur after standing up or if he stands up a while    He has noted it most while playing golf.   He recounts a recent episode while putting when he felt lightheaded and then his vision grayed but did not completely black out.   The episodes are not always associated with hot temperatures.       He has not regularly seen a PCP.     He was diagnosed with DM about a year ago after having a very elevated glucose = 500 and HgbA1c about 12.     He was started on Metformin and sugars are much better controlled and recent HgbA1c = 7.0.      Sometimes he gets a sore sensation in the upper neck / occiput.  This does not develop into a severe headache.       REVIEW OF SYSTEMS: Constitutional: No fevers, chills, sweats, or change in appetite Eyes: No visual changes, double vision, eye pain Ear, nose and throat: No hearing loss, ear pain, nasal congestion, sore throat Cardiovascular: No chest pain, palpitations Respiratory: No shortness of  breath at rest or with exertion.   No wheezes GastrointestinaI: No nausea, vomiting, diarrhea, abdominal pain, fecal incontinence Genitourinary: No dysuria, urinary retention or frequency.  No nocturia. Musculoskeletal: No neck pain, back pain Integumentary: No rash, pruritus, skin lesions Neurological: as above Psychiatric: No depression at this time.  No anxiety Endocrine: Has DM.   Hematologic/Lymphatic: No anemia, purpura, petechiae. Allergic/Immunologic: No itchy/runny eyes, nasal congestion, recent allergic reactions, rashes  ALLERGIES: No Known Allergies  HOME MEDICATIONS:  Current Outpatient Medications:  .  gabapentin (NEURONTIN) 300 MG capsule, Take 300 mg by mouth as needed. , Disp: , Rfl:  .  metFORMIN (GLUCOPHAGE) 500 MG tablet, TAKE 1 TABLET BY MOUTH TWICE A DAY WITH MEALS,  Disp: 40 tablet, Rfl: 4 .  omeprazole (PRILOSEC) 40 MG capsule, Take 1 capsule (40 mg total) by mouth daily., Disp: 90 capsule, Rfl: 3 .  lisinopril (PRINIVIL,ZESTRIL) 10 MG tablet, Take 1 tablet by mouth daily., Disp: , Rfl:   PAST MEDICAL HISTORY: Past Medical History:  Diagnosis Date  . Bladder cancer ([place])   . Diabetes mellitus, type II ([place])   . Neuropathy   . Vision abnormalities     PAST SURGICAL HISTORY: Past Surgical History:  Procedure Laterality Date  . bladder cancer     tumor removed  . INGUINAL HERNIA REPAIR Bilateral     FAMILY HISTORY: Family History  Problem Relation Age of Onset  . Diabetes Mother   . Hypertension Mother   . Kidney disease Mother   . Cancer Mother        Bile Duct Cancer/Pancreatic in 2003  . Pancreatic cancer Mother   . Hypertension Father   . Heart attack Father   . Stroke Father   . Congestive Heart Failure Father   . [place] White syndrome Father   . Colon cancer Neg Hx   . Esophageal cancer Neg Hx   . Inflammatory bowel disease Neg Hx   . Liver disease Neg Hx   . Rectal cancer Neg Hx   . Stomach cancer Neg Hx     SOCIAL HISTORY:  Social History   Socioeconomic History  . Marital status: Married    Spouse name: Not on file  . Number of children: 2  . Years of education: Not on file  . Highest education level: Not on file  Occupational History  . Occupation: retired  Tobacco Use  . Smoking status: Former Smoker    Types: Cigarettes    Quit date: 2019    Years since quitting: 2.0  . Smokeless tobacco: Never Used  Substance and Sexual Activity  . Alcohol use: Yes    Comment: 5-6 beers/wk  . Drug use: No  . Sexual activity: Not on file  Other Topics Concern  . Not on file  Social History Narrative  . Not on file   Social Determinants of Health   Financial Resource Strain:   . Difficulty of Paying Living Expenses: Not on file  Food Insecurity:   . Worried About Charity fundraiser in the Last Year:  Not on file  . Ran Out of Food in the Last Year: Not on file  Transportation Needs:   . Lack of Transportation (Medical): Not on file  . Lack of Transportation (Non-Medical): Not on file  Physical Activity:   . Days of Exercise per Week: Not on file  . Minutes of Exercise per Session: Not on file  Stress:   . Feeling of Stress : Not on file  Social Connections:   . Frequency of Communication with Friends and Family: Not on file  . Frequency of Social Gatherings with Friends and Family: Not on file  . Attends Religious Services: Not on file  . Active Member of Clubs or Organizations: Not on file  . Attends Archivist Meetings: Not on file  . Marital Status: Not on file  Intimate Partner Violence:   . Fear of Current or Ex-Partner: Not on file  . Emotionally Abused: Not on file  . Physically Abused: Not on file  . Sexually Abused: Not on file     PHYSICAL EXAM  Vitals:   06/23/19 0904  BP: 116/70  Pulse: 61  Temp: 98.4 F (36.9 C)  Weight: 170 lb 3.2 oz (77.2 kg)  Height: 5\' 10"  (1.778 m)    Body mass index is 24.42 kg/m.   General: The patient is well-developed and well-nourished and in no acute distress  Neck: The neck is supple, he has a left bruit (I did not hear in 2019).  The neck is currently nontender.  Cardiovascular: The heart has a regular rate and rhythm with a normal S1 and S2. There were no murmurs, gallops or rubs. Lungs are clear to auscultation.  Skin: Extremities are without rash or edema.   Neurologic Exam  Mental status: The patient is alert and oriented x 3 at the time of the examination. The patient has apparent normal recent and remote memory, with an apparently normal attention span and concentration ability.   Speech is normal.  Cranial nerves: Extraocular movements are full.   There is good facial sensation to soft touch bilaterally.Facial strength is normal.  Trapezius and sternocleidomastoid strength is normal. No dysarthria is  noted.   No obvious hearing deficits are noted.  Motor:  Muscle bulk is normal.   Tone is normal. Strength is  5 / 5 in all 4 extremities.   Sensory: Sensory testing is intact to pinprick, soft touch and vibration sensation in the arms.  He has  20% reduced sensation to the tuning fork in the feet but normal sensation to touch  Coordination: Cerebellar testing reveals good finger-nose-finger and heel-to-shin bilaterally.  Gait and station: Station is normal.   Gait is normal. Tandem gait is normal for age.. Romberg is negative.   Reflexes: Deep tendon reflexes are symmetric and normal bilaterally.         DIAGNOSTIC DATA (LABS, IMAGING, TESTING) - I reviewed patient records, labs, notes, testing and imaging myself where available.  Lab Results  Component Value Date   WBC 9.5 07/21/2018   HGB 15.3 07/21/2018   HCT 44.9 07/21/2018   MCV 96.1 07/21/2018   PLT 275.0 07/21/2018      Component Value Date/Time   NA 141 08/21/2017 1525   K 5.2 08/21/2017 1525   CL 102 08/21/2017 1525   CO2 25 08/21/2017 1525   GLUCOSE 155 (H) 08/21/2017 1525   BUN 18 08/21/2017 1525   CREATININE 1.08 08/21/2017 1525   CALCIUM 9.8 08/21/2017 1525   PROT 7.0 07/21/2018 1526   PROT 6.6 11/11/2017 1107   ALBUMIN 4.4 07/21/2018 1526   ALBUMIN 4.5 08/21/2017 1525   AST 13 07/21/2018 1526   ALT 28 07/21/2018 1526   ALKPHOS 53 07/21/2018 1526   BILITOT 0.5 07/21/2018 1526   GFRNONAA 72 08/21/2017 1525   GFRAA 83 08/21/2017 1525   Lab Results  Component Value Date   CHOL 139 01/10/2017   HDL 36 01/10/2017  [place] 86 01/10/2017   TRIG 83 01/10/2017   Lab Results  Component Value Date   HGBA1C 7.0 (H) 08/21/2017   Lab Results  Component Value Date   VITAMINB12 1,050 11/11/2017   Lab Results  Component Value Date   TSH 1.45 01/10/2017       ASSESSMENT AND PLAN  History of stroke  Diabetic polyneuropathy associated with type 2 diabetes mellitus (HCC)  Left carotid  bruit  Dysesthesia   1.    Most likely the recent MRI just shows the same chronic infarction seen in 2019.   We have requested the actual images to do a side-by-side comparison. 2.    Advised to take 81 mg ASA daily. 3.    Carotid ultrasound to assess for stroke and left bruit 4.    Alpha lipoic acid for dysesthesias likely from mild diabetic polyneuropathy.   5.   Follow up as needed.  45 minutes with face to face interaction, counseling/coordinating, reviewing ED records, personally reviewing the 2019 studies and MRIs.    Namine Beahm A. Felecia Shelling, MD, [facility] [id number], 0000000 AM Certified in Neurology, Clinical Neurophysiology, Sleep Medicine, Pain Medicine and Neuroimaging  [facility] [address] [place], [place] [address] [phone]

## 2019-06-23 NOTE — Patient Instructions (Signed)
ALPHA LIPOIC ACID 600 mg twice a day May help foot pain

## 2019-06-23 NOTE — Telephone Encounter (Signed)
Request faxed on 06/23/2019 to [phone] requesting pt cd.Danny Wagner

## 2019-06-25 IMAGING — US US ABDOMEN COMPLETE
1 series · 14 of 25 positions shown · non-contrast
Comparison: None.

CLINICAL DATA: 67-year-old male with decreased appetite, abdominal
pain and unintentional weight loss.

EXAM:
ABDOMEN ULTRASOUND COMPLETE

[Series 1: us abdomen complete · 14 of 92 slices shown]
[im 1/92]
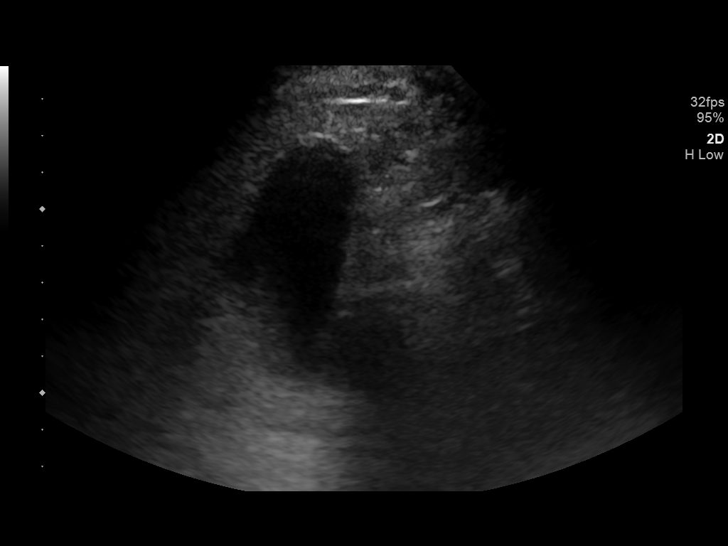
[im 8/92]
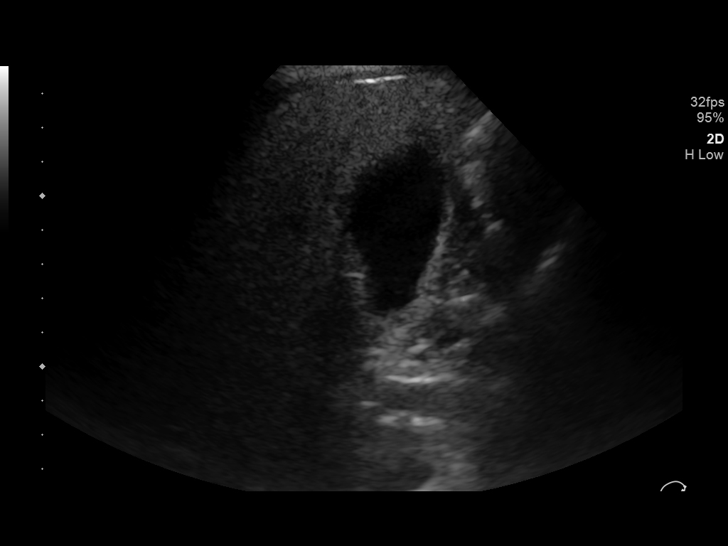
[im 16/92]
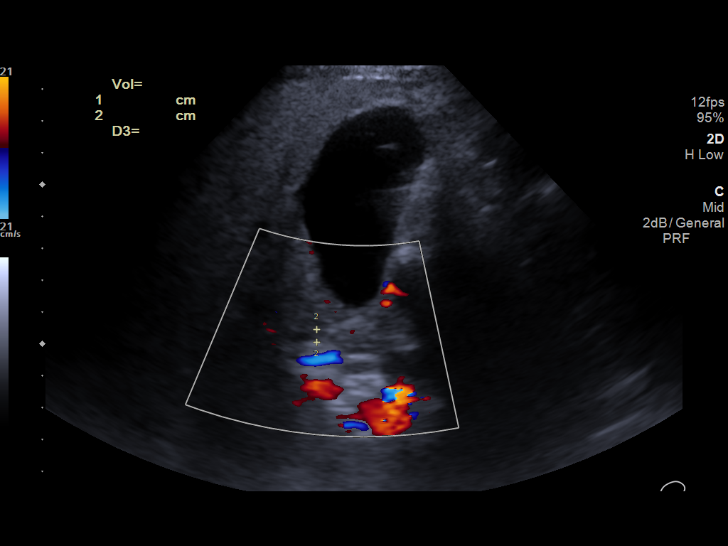
[im 23/92]
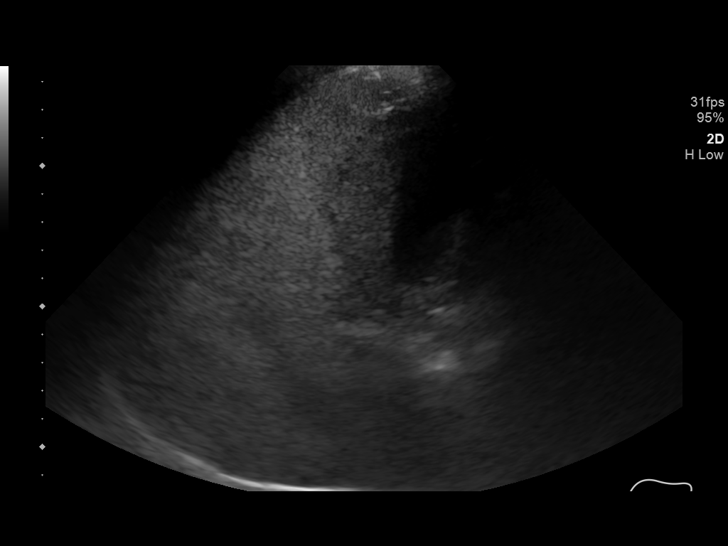
[im 31/92]
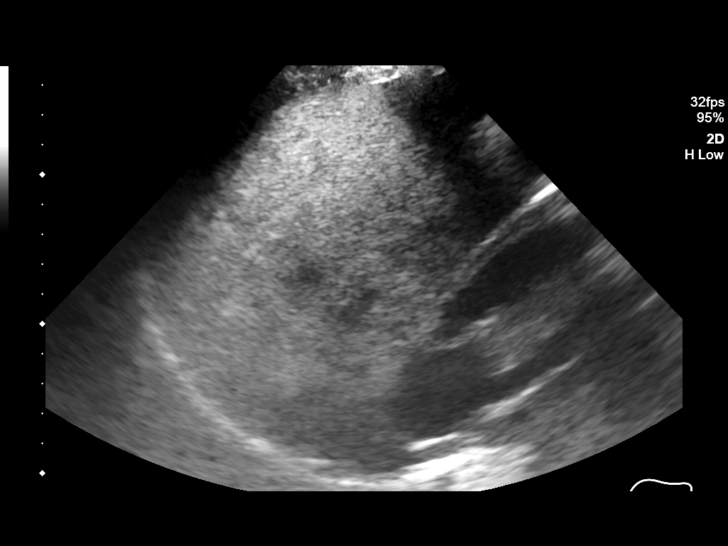
[im 35/92]
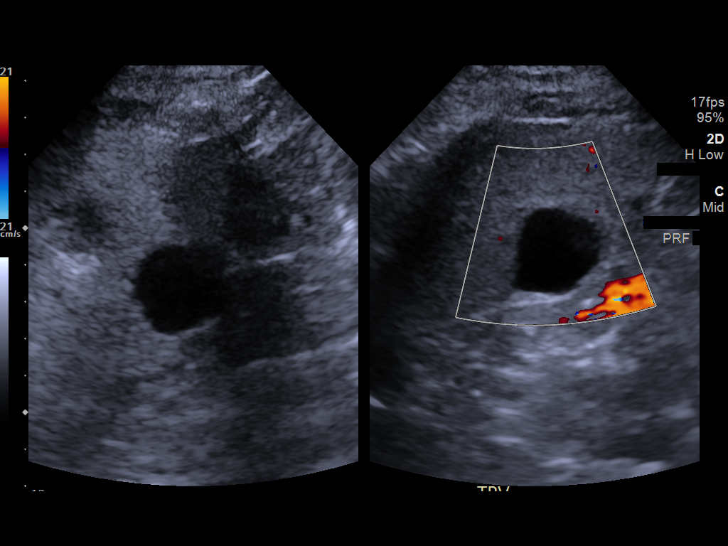
[im 42/92]
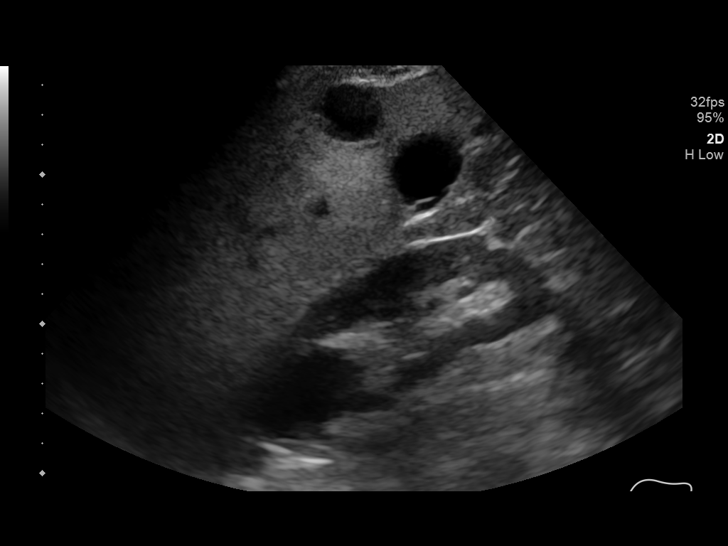
[im 50/92]
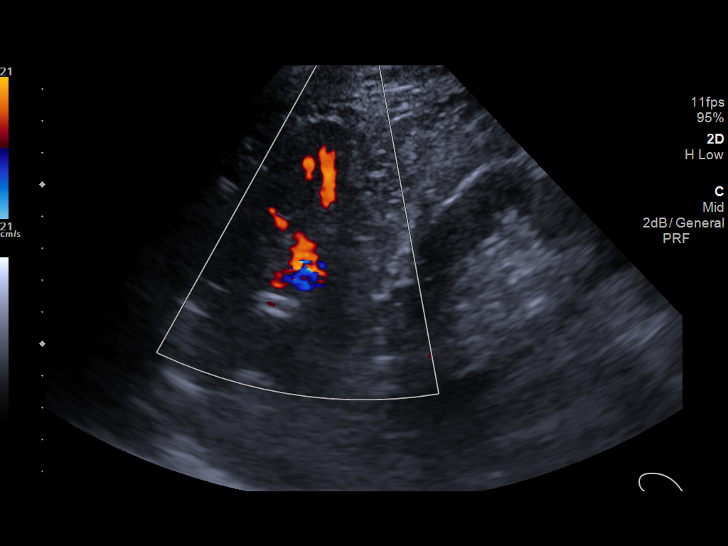
[im 57/92]
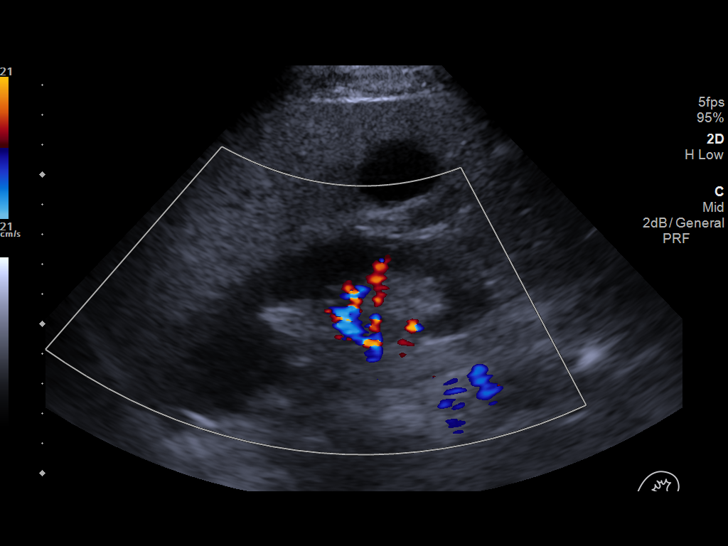
[im 61/92]
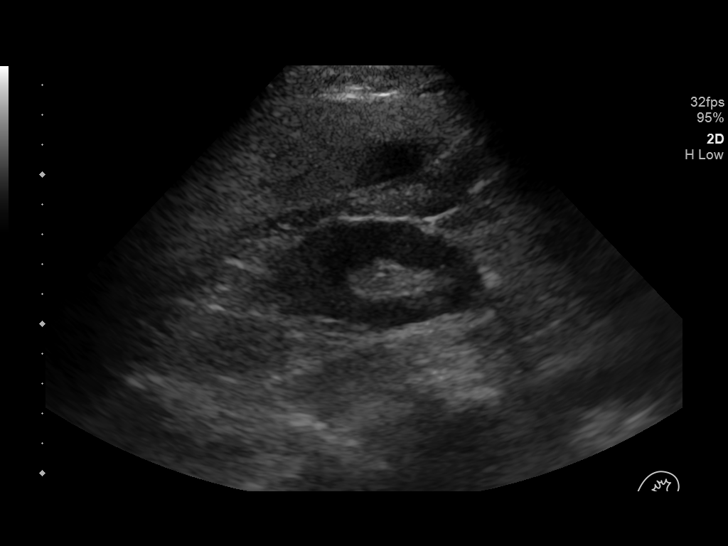
[im 69/92]
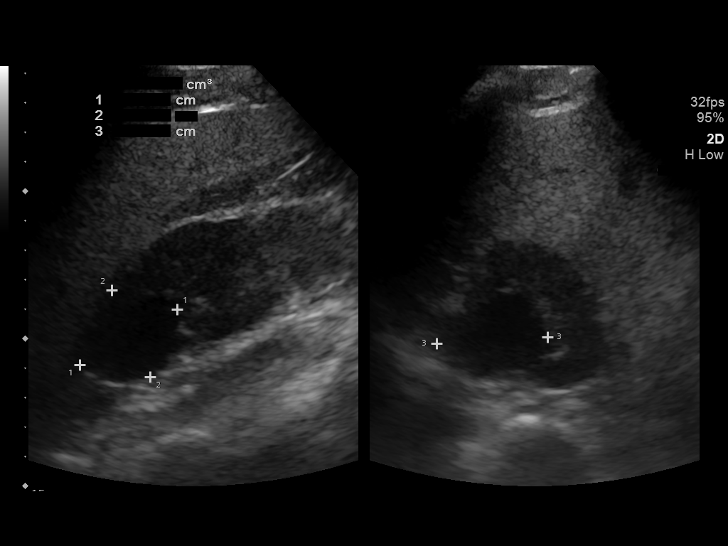
[im 76/92]
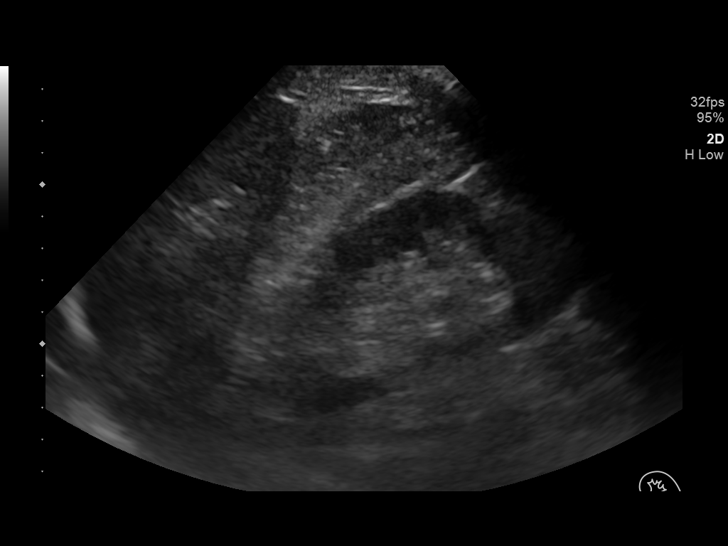
[im 84/92]
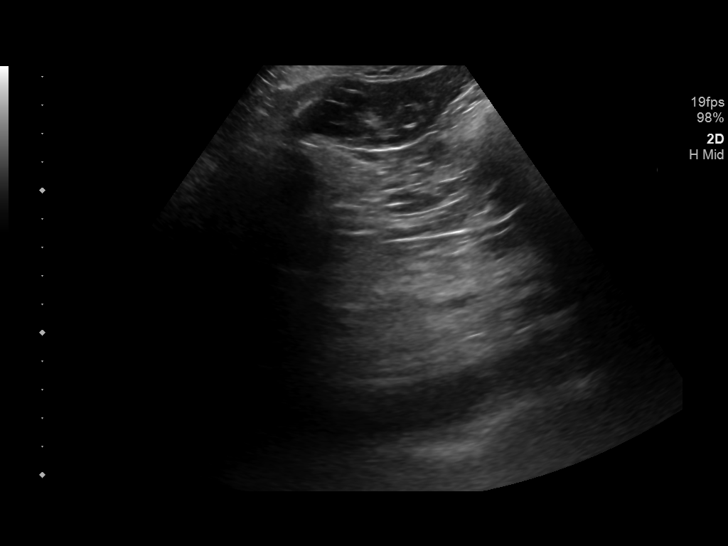
[im 92/92]
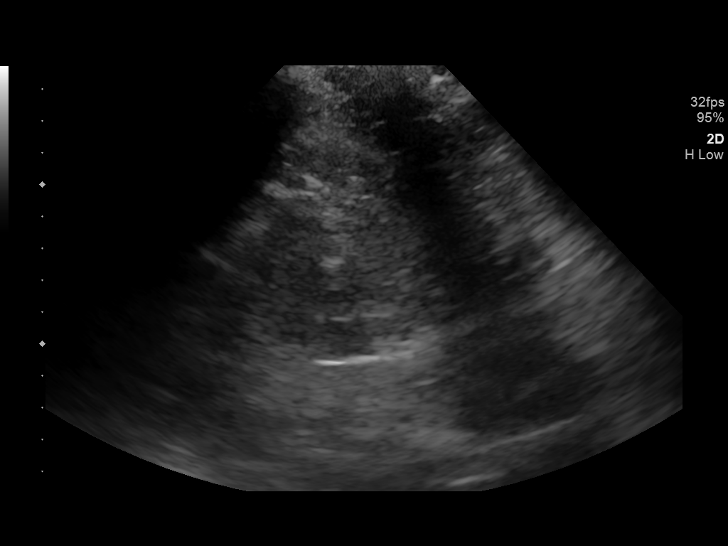

[14 of 25 positions shown; findings below may reference images not displayed]

FINDINGS: Gallbladder: The gallbladder is unremarkable. There is no evidence
of cholelithiasis or acute cholecystitis.

Common bile duct: Diameter: 4 mm. No intrahepatic or extrahepatic
biliary dilatation.

Liver: Diffuse increased hepatic echogenicity noted. Several RIGHT
hepatic cysts are present. No solid lesions noted. Portal vein is
patent on color Doppler imaging with normal direction of blood flow
towards the liver.

IVC: No abnormality visualized.

Pancreas: Not visualized

Spleen: Size and appearance within normal limits.

Right Kidney: Length: 10.7 cm. A 3.8 cm UPPER pole cyst is noted.
Echogenicity within normal limits. No solid mass or hydronephrosis
visualized.

Left Kidney: Length: 11.9 cm. Echogenicity within normal limits. No
mass or hydronephrosis visualized.

Abdominal aorta: No aneurysm visualized.

Other findings: None.
IMPRESSION: 1. No evidence of acute abnormality.
2. Hepatic steatosis

## 2019-07-09 ENCOUNTER — Institutional Professional Consult (permissible substitution): Payer: 59 | Admitting: Neurology
